# Patient Record
Sex: Female | Born: 1970 | Race: White | Hispanic: No | Marital: Single | Smoking: Never smoker
Health system: Southern US, Community
[De-identification: ages and names within clinical notes are randomized; demographics above are authoritative.]

## PROBLEM LIST (undated history)

## (undated) DIAGNOSIS — D219 Benign neoplasm of connective and other soft tissue, unspecified: Secondary | ICD-10-CM

## (undated) DIAGNOSIS — N200 Calculus of kidney: Secondary | ICD-10-CM

## (undated) DIAGNOSIS — I10 Essential (primary) hypertension: Secondary | ICD-10-CM

## (undated) HISTORY — PX: FINGER SURGERY: SHX640

## (undated) HISTORY — PX: KNEE SURGERY: SHX244

## (undated) HISTORY — DX: Benign neoplasm of connective and other soft tissue, unspecified: D21.9

## (undated) HISTORY — PX: LITHOTRIPSY: SUR834

## (undated) HISTORY — PX: TUBAL LIGATION: SHX77

## (undated) HISTORY — PX: APPENDECTOMY: SHX54

---

## 2004-06-01 ENCOUNTER — Inpatient Hospital Stay: Payer: Self-pay | Admitting: Surgery

## 2004-06-01 ENCOUNTER — Other Ambulatory Visit: Payer: Self-pay

## 2004-09-11 ENCOUNTER — Emergency Department: Payer: Self-pay | Admitting: Emergency Medicine

## 2005-03-08 ENCOUNTER — Emergency Department: Payer: Self-pay | Admitting: General Practice

## 2005-07-18 ENCOUNTER — Emergency Department: Payer: Self-pay | Admitting: Emergency Medicine

## 2005-10-16 ENCOUNTER — Emergency Department: Payer: Self-pay | Admitting: Internal Medicine

## 2005-12-28 ENCOUNTER — Emergency Department: Payer: Self-pay | Admitting: Emergency Medicine

## 2005-12-29 ENCOUNTER — Other Ambulatory Visit: Payer: Self-pay

## 2005-12-29 ENCOUNTER — Emergency Department: Payer: Self-pay | Admitting: Emergency Medicine

## 2006-06-19 ENCOUNTER — Emergency Department: Payer: Self-pay | Admitting: Emergency Medicine

## 2007-08-24 ENCOUNTER — Other Ambulatory Visit: Payer: Self-pay

## 2007-08-25 ENCOUNTER — Inpatient Hospital Stay: Payer: Self-pay | Admitting: Internal Medicine

## 2007-09-01 ENCOUNTER — Emergency Department: Payer: Self-pay | Admitting: Emergency Medicine

## 2007-09-06 ENCOUNTER — Emergency Department: Payer: Self-pay | Admitting: Emergency Medicine

## 2007-09-07 ENCOUNTER — Emergency Department: Payer: Self-pay | Admitting: Emergency Medicine

## 2008-09-04 ENCOUNTER — Ambulatory Visit: Payer: Self-pay | Admitting: Family Medicine

## 2008-10-07 ENCOUNTER — Emergency Department: Payer: Self-pay | Admitting: Emergency Medicine

## 2008-10-20 ENCOUNTER — Emergency Department: Payer: Self-pay | Admitting: Emergency Medicine

## 2008-11-01 ENCOUNTER — Emergency Department: Payer: Self-pay | Admitting: Emergency Medicine

## 2008-12-20 ENCOUNTER — Emergency Department: Payer: Self-pay | Admitting: Emergency Medicine

## 2008-12-22 ENCOUNTER — Emergency Department (HOSPITAL_COMMUNITY): Admission: EM | Admit: 2008-12-22 | Discharge: 2008-12-22 | Payer: Self-pay | Admitting: Emergency Medicine

## 2009-02-03 ENCOUNTER — Encounter: Payer: Self-pay | Admitting: Obstetrics & Gynecology

## 2009-02-03 ENCOUNTER — Ambulatory Visit: Payer: Self-pay | Admitting: Obstetrics & Gynecology

## 2009-02-03 ENCOUNTER — Encounter: Payer: Self-pay | Admitting: Family Medicine

## 2009-02-03 LAB — CONVERTED CEMR LAB
Hemoglobin: 14.7 g/dL (ref 12.0–15.0)
INR: 1.1 (ref 0.0–1.5)
Prolactin: 8.3 ng/mL
WBC: 9.3 10*3/uL (ref 4.0–10.5)

## 2009-04-24 ENCOUNTER — Emergency Department (HOSPITAL_COMMUNITY): Admission: EM | Admit: 2009-04-24 | Discharge: 2009-04-24 | Payer: Self-pay | Admitting: Emergency Medicine

## 2009-04-30 ENCOUNTER — Emergency Department (HOSPITAL_COMMUNITY): Admission: EM | Admit: 2009-04-30 | Discharge: 2009-04-30 | Payer: Self-pay | Admitting: Emergency Medicine

## 2009-05-26 ENCOUNTER — Ambulatory Visit: Payer: Self-pay | Admitting: Family Medicine

## 2009-05-27 ENCOUNTER — Emergency Department (HOSPITAL_COMMUNITY): Admission: EM | Admit: 2009-05-27 | Discharge: 2009-05-27 | Payer: Self-pay | Admitting: Emergency Medicine

## 2009-06-10 ENCOUNTER — Emergency Department (HOSPITAL_COMMUNITY): Admission: EM | Admit: 2009-06-10 | Discharge: 2009-06-10 | Payer: Self-pay | Admitting: Emergency Medicine

## 2009-06-12 ENCOUNTER — Ambulatory Visit (HOSPITAL_BASED_OUTPATIENT_CLINIC_OR_DEPARTMENT_OTHER): Admission: RE | Admit: 2009-06-12 | Discharge: 2009-06-12 | Payer: Self-pay | Admitting: Urology

## 2009-06-14 ENCOUNTER — Emergency Department (HOSPITAL_COMMUNITY): Admission: EM | Admit: 2009-06-14 | Discharge: 2009-06-14 | Payer: Self-pay | Admitting: Emergency Medicine

## 2009-06-16 ENCOUNTER — Emergency Department: Payer: Self-pay | Admitting: Emergency Medicine

## 2009-06-17 ENCOUNTER — Emergency Department (HOSPITAL_COMMUNITY): Admission: EM | Admit: 2009-06-17 | Discharge: 2009-06-17 | Payer: Self-pay | Admitting: Emergency Medicine

## 2009-07-03 ENCOUNTER — Ambulatory Visit: Payer: Self-pay | Admitting: Pain Medicine

## 2009-09-18 ENCOUNTER — Emergency Department: Payer: Self-pay | Admitting: Emergency Medicine

## 2009-09-25 ENCOUNTER — Emergency Department: Payer: Self-pay | Admitting: Emergency Medicine

## 2009-11-17 ENCOUNTER — Emergency Department (HOSPITAL_COMMUNITY): Admission: EM | Admit: 2009-11-17 | Discharge: 2009-11-17 | Payer: Self-pay | Admitting: Emergency Medicine

## 2010-01-06 ENCOUNTER — Emergency Department (HOSPITAL_COMMUNITY): Admission: EM | Admit: 2010-01-06 | Discharge: 2010-01-06 | Payer: Self-pay | Admitting: Emergency Medicine

## 2010-01-13 ENCOUNTER — Emergency Department: Payer: Self-pay | Admitting: Internal Medicine

## 2010-01-14 ENCOUNTER — Emergency Department (HOSPITAL_COMMUNITY): Admission: EM | Admit: 2010-01-14 | Discharge: 2010-01-14 | Payer: Self-pay | Admitting: Internal Medicine

## 2010-02-14 ENCOUNTER — Emergency Department: Payer: Self-pay | Admitting: Emergency Medicine

## 2010-02-22 ENCOUNTER — Emergency Department: Payer: Self-pay | Admitting: Emergency Medicine

## 2010-04-09 ENCOUNTER — Emergency Department: Payer: Self-pay | Admitting: Emergency Medicine

## 2010-04-25 IMAGING — CR DG HIP (WITH OR WITHOUT PELVIS) 2-3V*L*
3 series · 3 of 3 positions shown · non-contrast
Comparison: None

CLINICAL DATA: Fell with back pain and left hip pain

LEFT HIP - COMPLETE 2+ VIEW

[t pelvis a.p.]
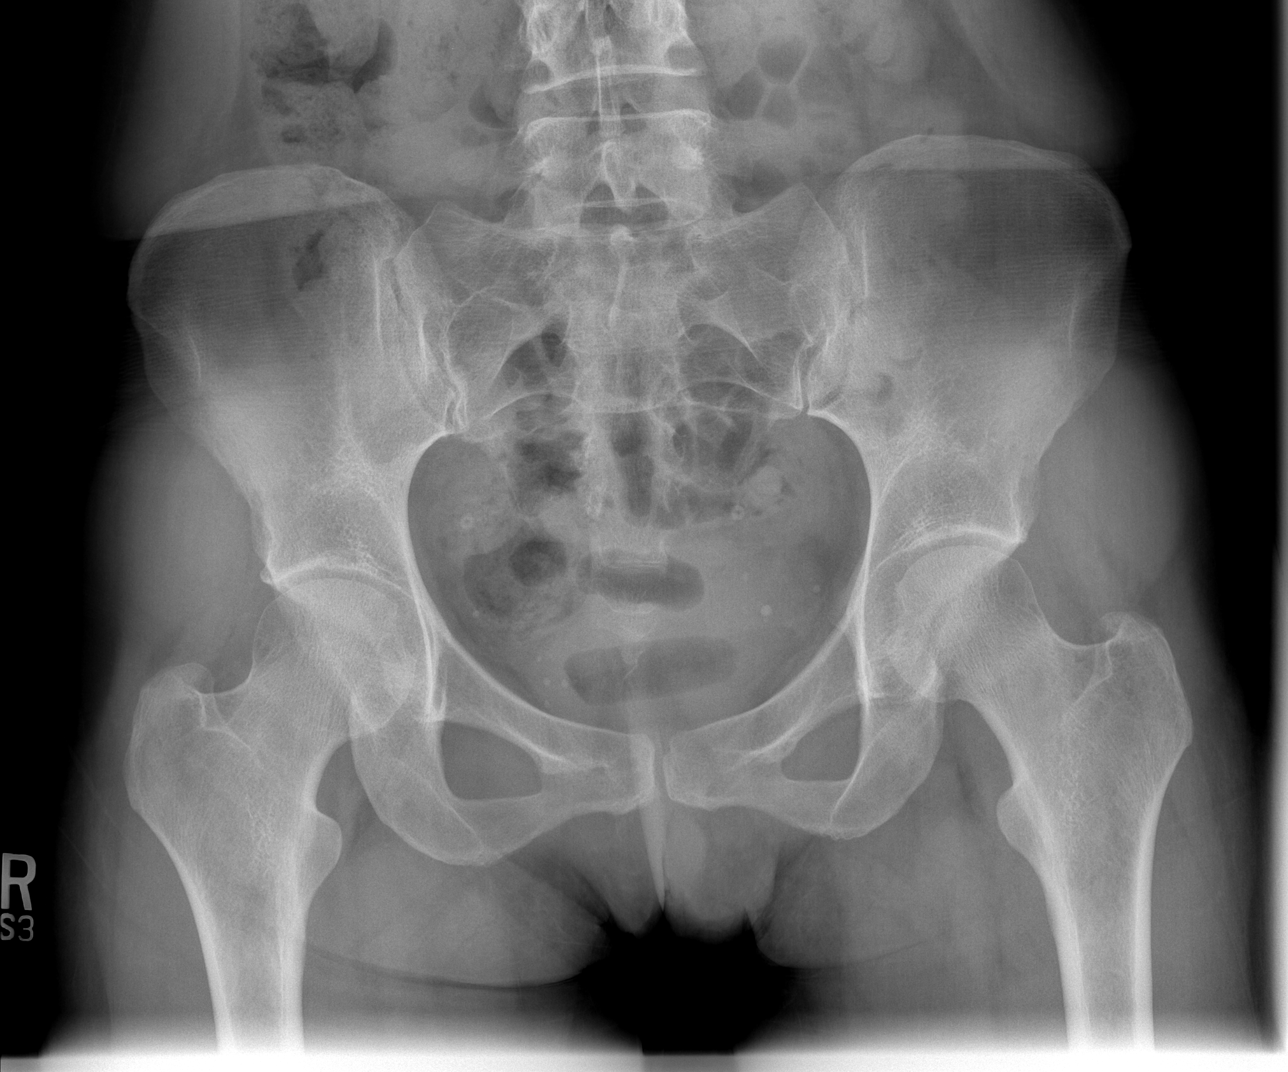

[t hip ap left]
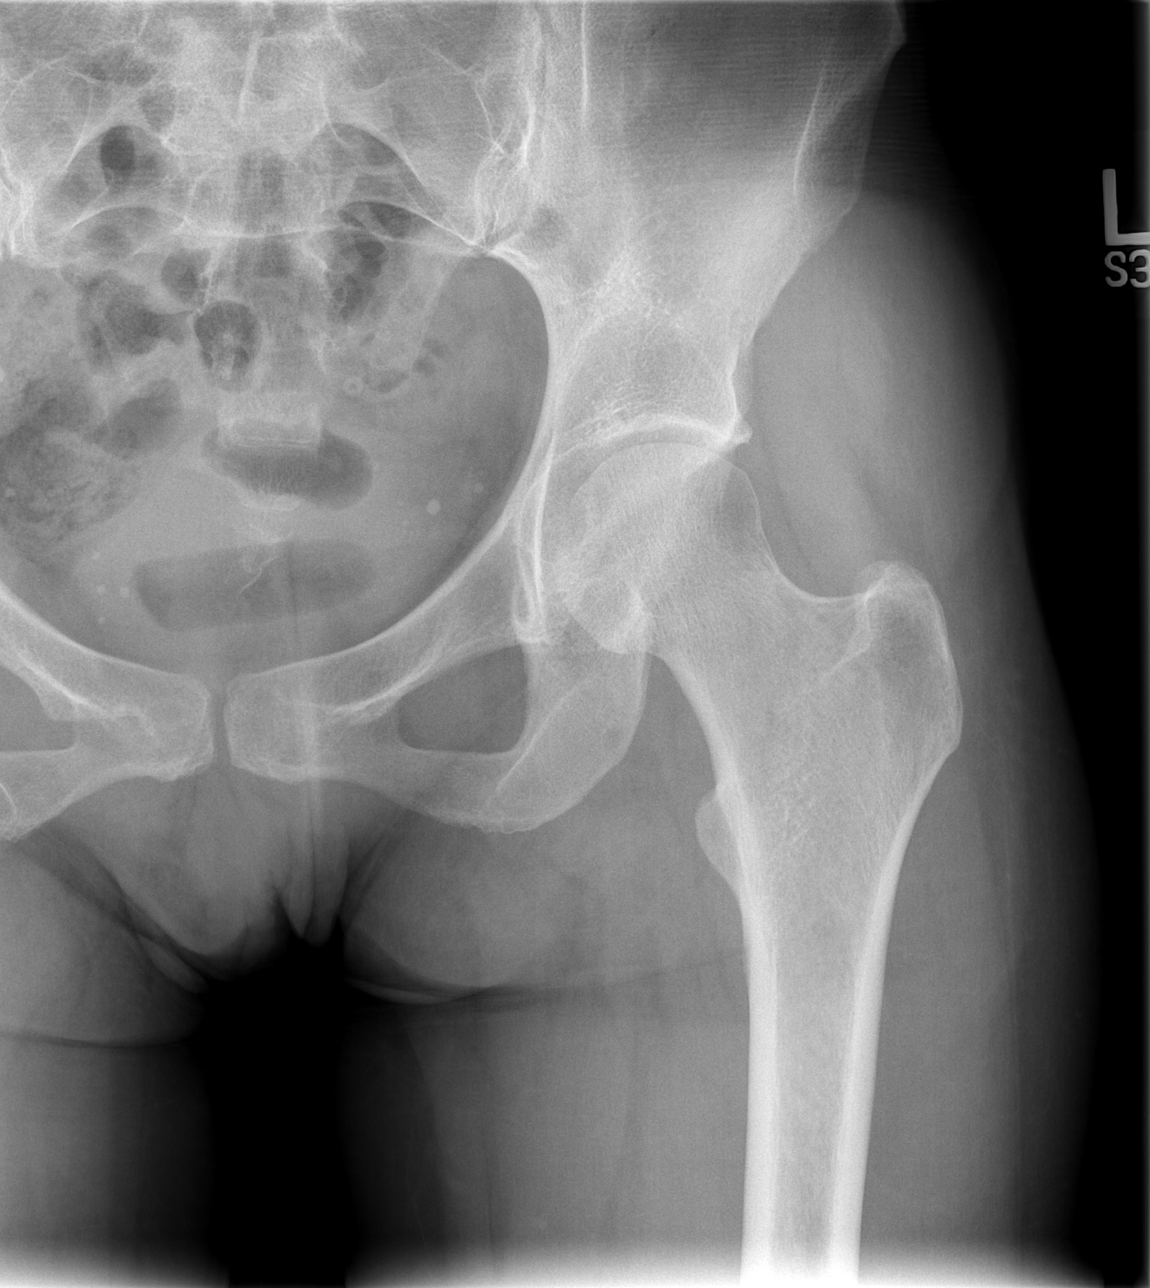

[t hip frog leg left]
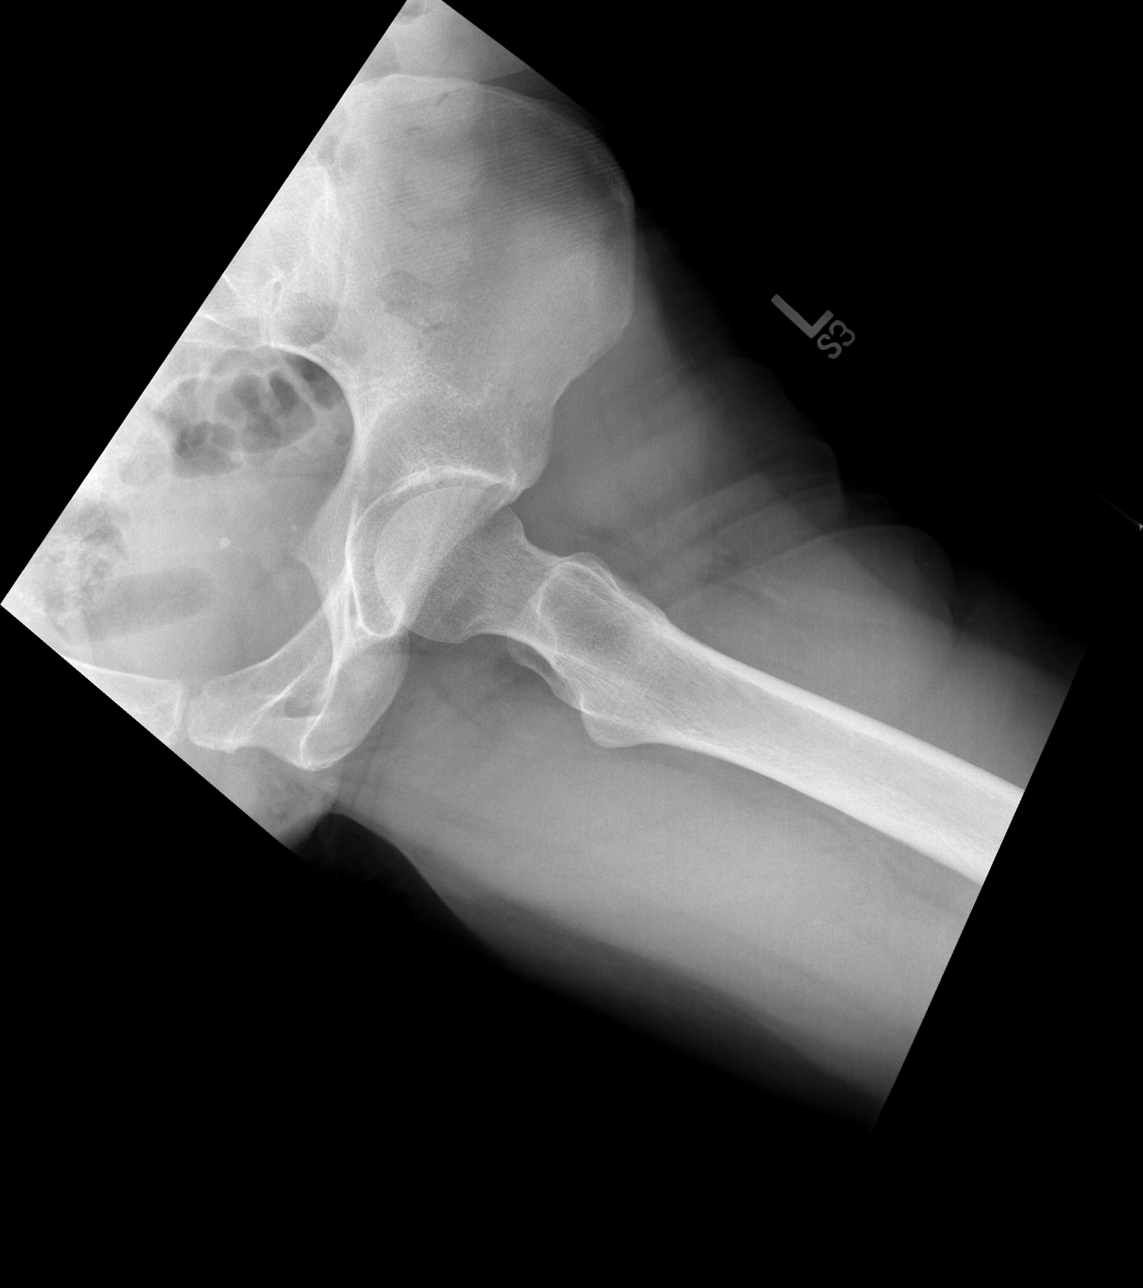

[3 of 3 positions shown; findings below may reference images not displayed]

FINDINGS: Both hips are in normal position.  No acute fracture is
seen.  The pelvic rami are intact and the SI joints appear normal.
IMPRESSION: Negative left hip.

## 2010-05-02 ENCOUNTER — Emergency Department: Payer: Self-pay | Admitting: Emergency Medicine

## 2010-05-03 ENCOUNTER — Emergency Department: Payer: Self-pay | Admitting: Internal Medicine

## 2010-05-12 ENCOUNTER — Ambulatory Visit: Payer: Self-pay | Admitting: Unknown Physician Specialty

## 2010-05-15 ENCOUNTER — Encounter
Admission: RE | Admit: 2010-05-15 | Discharge: 2010-05-19 | Payer: Self-pay | Source: Home / Self Care | Attending: Physical Medicine & Rehabilitation | Admitting: Physical Medicine & Rehabilitation

## 2010-05-19 ENCOUNTER — Ambulatory Visit: Payer: Self-pay | Admitting: Physical Medicine & Rehabilitation

## 2010-05-30 ENCOUNTER — Ambulatory Visit: Payer: Self-pay | Admitting: Unknown Physician Specialty

## 2010-08-07 ENCOUNTER — Ambulatory Visit: Payer: Self-pay | Admitting: Physical Medicine & Rehabilitation

## 2010-09-07 ENCOUNTER — Emergency Department: Payer: Self-pay | Admitting: Emergency Medicine

## 2010-10-09 ENCOUNTER — Emergency Department: Payer: Self-pay | Admitting: Emergency Medicine

## 2010-11-17 LAB — BASIC METABOLIC PANEL
BUN: 9 mg/dL (ref 6–23)
CO2: 33 mEq/L — ABNORMAL HIGH (ref 19–32)
Calcium: 10.1 mg/dL (ref 8.4–10.5)
Chloride: 103 mEq/L (ref 96–112)
Creatinine, Ser: 1.05 mg/dL (ref 0.4–1.2)
GFR calc non Af Amer: 59 mL/min — ABNORMAL LOW (ref >60)
Glucose, Bld: 90 mg/dL (ref 70–99)
Potassium: 4.5 mEq/L (ref 3.5–5.1)

## 2010-11-17 LAB — URINE MICROSCOPIC-ADD ON

## 2010-11-17 LAB — CBC
Hemoglobin: 16.9 g/dL — ABNORMAL HIGH (ref 12.0–15.0)
MCHC: 34.8 g/dL (ref 30.0–36.0)
RDW: 13.6 % (ref 11.5–15.5)

## 2010-11-17 LAB — URINALYSIS, ROUTINE W REFLEX MICROSCOPIC
Glucose, UA: NEGATIVE mg/dL
Ketones, ur: 15 mg/dL — AB
pH: 7 (ref 5.0–8.0)

## 2010-11-17 LAB — DIFFERENTIAL
Basophils Relative: 0 % (ref 0–1)
Lymphocytes Relative: 24 % (ref 12–46)

## 2010-11-17 LAB — POCT PREGNANCY, URINE: Preg Test, Ur: NEGATIVE

## 2010-11-18 ENCOUNTER — Emergency Department: Payer: Self-pay | Admitting: Emergency Medicine

## 2010-12-03 LAB — URINALYSIS, ROUTINE W REFLEX MICROSCOPIC
Bilirubin Urine: NEGATIVE
Glucose, UA: NEGATIVE mg/dL
Glucose, UA: NEGATIVE mg/dL
Ketones, ur: NEGATIVE mg/dL
Ketones, ur: NEGATIVE mg/dL
Ketones, ur: 15 mg/dL — AB
Leukocytes, UA: NEGATIVE
Nitrite: NEGATIVE
Protein, ur: NEGATIVE mg/dL
Protein, ur: 30 mg/dL — AB
Urobilinogen, UA: 0.2 mg/dL (ref 0.0–1.0)
pH: 8 (ref 5.0–8.0)

## 2010-12-03 LAB — COMPREHENSIVE METABOLIC PANEL
ALT: 15 U/L (ref 0–35)
Albumin: 3.6 g/dL (ref 3.5–5.2)
Alkaline Phosphatase: 35 U/L — ABNORMAL LOW (ref 39–117)
GFR calc Af Amer: >60 mL/min (ref >60)
Potassium: 3.9 mEq/L (ref 3.5–5.1)
Sodium: 138 mEq/L (ref 135–145)

## 2010-12-03 LAB — PREGNANCY, URINE: Preg Test, Ur: NEGATIVE

## 2010-12-03 LAB — CBC
HCT: 35.5 % — ABNORMAL LOW (ref 36.0–46.0)
Hemoglobin: 13.3 g/dL (ref 12.0–15.0)
MCHC: 34.9 g/dL (ref 30.0–36.0)
MCV: 91.8 fL (ref 78.0–100.0)
MCV: 90.6 fL (ref 78.0–100.0)
Platelets: 156 10*3/uL (ref 150–400)
RBC: 4.2 MIL/uL (ref 3.87–5.11)
RBC: 4.3 MIL/uL (ref 3.87–5.11)
RDW: 13.2 % (ref 11.5–15.5)
RDW: 13.6 % (ref 11.5–15.5)
WBC: 6.5 10*3/uL (ref 4.0–10.5)
WBC: 6.6 10*3/uL (ref 4.0–10.5)

## 2010-12-03 LAB — POCT I-STAT, CHEM 8
BUN: 13 mg/dL (ref 6–23)
Chloride: 105 mEq/L (ref 96–112)
Creatinine, Ser: 0.8 mg/dL (ref 0.4–1.2)
Glucose, Bld: 88 mg/dL (ref 70–99)
Potassium: 3.9 mEq/L (ref 3.5–5.1)
Sodium: 139 mEq/L (ref 135–145)

## 2010-12-03 LAB — DIFFERENTIAL
Basophils Absolute: 0 10*3/uL (ref 0.0–0.1)
Basophils Absolute: 0 10*3/uL (ref 0.0–0.1)
Basophils Relative: 0 % (ref 0–1)
Eosinophils Absolute: 0 10*3/uL (ref 0.0–0.7)
Eosinophils Absolute: 0 10*3/uL (ref 0.0–0.7)
Eosinophils Absolute: 0 10*3/uL (ref 0.0–0.7)
Eosinophils Relative: 0 % (ref 0–5)
Lymphocytes Relative: 14 % (ref 12–46)
Lymphocytes Relative: 31 % (ref 12–46)
Lymphs Abs: 0.9 10*3/uL (ref 0.7–4.0)
Lymphs Abs: 1.4 10*3/uL (ref 0.7–4.0)
Lymphs Abs: 2 10*3/uL (ref 0.7–4.0)
Monocytes Relative: 7 % (ref 3–12)
Monocytes Relative: 9 % (ref 3–12)
Neutro Abs: 3.9 10*3/uL (ref 1.7–7.7)
Neutrophils Relative %: 59 % (ref 43–77)

## 2010-12-03 LAB — URINE MICROSCOPIC-ADD ON

## 2010-12-03 LAB — BASIC METABOLIC PANEL
BUN: 12 mg/dL (ref 6–23)
Chloride: 105 mEq/L (ref 96–112)
Glucose, Bld: 86 mg/dL (ref 70–99)
Potassium: 4 mEq/L (ref 3.5–5.1)

## 2010-12-03 LAB — LIPASE, BLOOD: Lipase: 23 U/L (ref 11–59)

## 2010-12-04 LAB — BASIC METABOLIC PANEL WITH GFR
BUN: 13 mg/dL (ref 6–23)
CO2: 25 meq/L (ref 19–32)
Calcium: 8.7 mg/dL (ref 8.4–10.5)
Chloride: 108 meq/L (ref 96–112)
Creatinine, Ser: 1.09 mg/dL (ref 0.4–1.2)
GFR calc non Af Amer: 57 mL/min — ABNORMAL LOW
Glucose, Bld: 81 mg/dL (ref 70–99)
Potassium: 3.8 meq/L (ref 3.5–5.1)
Sodium: 138 meq/L (ref 135–145)

## 2010-12-04 LAB — CBC
HCT: 39.9 % (ref 36.0–46.0)
Hemoglobin: 13.8 g/dL (ref 12.0–15.0)
MCHC: 34.6 g/dL (ref 30.0–36.0)
MCV: 90.8 fL (ref 78.0–100.0)
Platelets: 141 K/uL — ABNORMAL LOW (ref 150–400)
RBC: 4.4 MIL/uL (ref 3.87–5.11)
RDW: 12.7 % (ref 11.5–15.5)
WBC: 5.4 K/uL (ref 4.0–10.5)

## 2010-12-04 LAB — WET PREP, GENITAL
Clue Cells Wet Prep HPF POC: NONE SEEN
Trich, Wet Prep: NONE SEEN
Yeast Wet Prep HPF POC: NONE SEEN

## 2010-12-04 LAB — GC/CHLAMYDIA PROBE AMP, GENITAL
Chlamydia, DNA Probe: NEGATIVE
GC Probe Amp, Genital: NEGATIVE

## 2010-12-04 LAB — URINE MICROSCOPIC-ADD ON

## 2010-12-04 LAB — URINALYSIS, ROUTINE W REFLEX MICROSCOPIC
Glucose, UA: NEGATIVE mg/dL
Specific Gravity, Urine: 1.04 — ABNORMAL HIGH (ref 1.005–1.030)

## 2010-12-04 LAB — DIFFERENTIAL
Basophils Relative: 0 % (ref 0–1)
Eosinophils Absolute: 0.1 10*3/uL (ref 0.0–0.7)
Monocytes Relative: 9 % (ref 3–12)
Neutro Abs: 3.5 10*3/uL (ref 1.7–7.7)
Neutrophils Relative %: 65 % (ref 43–77)

## 2011-01-12 NOTE — Assessment & Plan Note (Signed)
Nancy Roberson, Nancy Roberson             ACCOUNT NO.:  [id number]   MEDICAL RECORD NO.:  [id number]          PATIENT TYPE:  POB   LOCATION:  [facility] at [facility]         FACILITY:  [facility]   PHYSICIAN:  Johnella Moloney, MD        DATE OF BIRTH:  04/10/1971   DATE OF SERVICE:  02/03/2009                                  CLINIC NOTE   CHIEF COMPLAINT:  Menometrorrhagia, pelvic pain, possible left breast  lump.   HISTORY OF PRESENT ILLNESS:  The patient is a 40 year old gravida 5,  para 3-0-2-3 who presents today with a few issues.  The patient reports  menometrorrhagia.  She had menarche at age 56 and has never had regular  cycles because she says for many years her periods came about every 1 or  1-1/2 months and lasted for 2-3 days.  However in November 2009, she had  an episode of bleeding that lasted over 2 months.  After that, she had  another episode of bleeding in about 6 weeks later and then over the  last month she bled continuously almost every day.  The patient is very  concerned about all those bleeding.  She does report that she had a  pelvic ultrasound at [facility] in February 2010  which was within normal limits.  However, we do not have access to this  ultrasound at this point, but she feels like her symptoms have gotten  worse since then.  The patient denies any dizziness, syncope, or any  other symptoms.  The patient's 2nd issue is of pelvic pain.  She has  experienced continuous pain on her right side for the last week.  The  patient has been seen in the past for ovulation pain and other episodes  of pelvic pain on the right side which with a negative evaluation this  pain is not unlike any pain that she has had before, but it has been  persistent for the last week.  Next issue is of her left breast lump.  The patient does have a history of her mother and grandmother diagnosed  in their 34s.  The grandmother also has a history of ovarian cancer.  The  patient is very concerned about this lump and wants further  evaluation.  The patient denies any other complaints.   PAST OBSTETRICS/GYNECOLOGIC HISTORY:  Menstrual history as above.  The  patient has had 2 miscarriages, one vaginal delivery and one cesarean  section.  She also had a bilateral tubal ligation.  Her last Pap smear  was several months ago which was normal.  She has had a history of  cervical dysplasia many years ago but normal Pap afterwards.  She has  never had a mammogram.   PAST MEDICAL HISTORY:  Nephritis, asthma, hypercholesterinemia, high  blood pressure.   PAST SURGICAL HISTORY:  Cesarean section and thumb reconstruction.   MEDICATIONS:  None.   ALLERGIES:  PENICILLIN and BIAXIN.   SOCIAL HISTORY:  The patient lives with her significant other.  She does  not smoke, drink, or use any drugs.  She denies any past or current  history of sexual physical abuse.   FAMILY HISTORY:  Remarkable for mother who was diagnosed with breast  cancer in the 67s, grandmother with breast and ovarian cancer in her  83s.  There is also an extensive history of heart disease and high blood  pressure in the family.  The patient and her family members have not  undergone any genetic testing for breast cancer or ovarian cancer  syndromes.   REVIEW OF SYSTEMS:  The patient has multiple symptoms including  bruising, numbness, muscle aches, fatigue, weight loss, frequent  headaches, hot flashes, vaginal bleeding, and pain with intercourse.   PHYSICAL EXAMINATION:  VITAL SIGNS:  Blood pressure 148/93, pulse 85,  weight 136, height 5 feet 5 inches.  GENERAL:  No apparent distress.  LUNGS:  Clear to auscultation bilaterally.  HEART:  Regular rate and rhythm.  NECK:  Supple.  No masses.  BREASTS:  Symmetric in size, nontender.  The patient has heterogeneous  dense tissue bilaterally.  No circumscribed masses palpated on the left  or the right side.  No abnormal skin changes or nipple  drainage or  lymphadenopathy palpated.  ABDOMEN:  Soft, some right lower quadrant tenderness on deep palpation.  No rebound or guarding.  EXTREMITIES:  The patient was noted to have some bruising on bilateral  lower extremities which she says started about a week ago, otherwise, no  clubbing or edema.  Nontender.   ENDOMETRIAL BIOPSY:  The patient was counseled regarding the need for  endometrial biopsy given her irregular bleeding.  The risks of procedure  were reviewed with the patient and written and informed consent was  obtained.  During a pelvic examination, speculum was placed into the  vagina and her cervix was swabbed with Betadine x3.  A tenaculum was  placed on the anterior lip of the cervix and then a 3-mm Pipelle was  slowly introduced into uterine fundus.  This was introduced to a depth  of 8 cm and suction was created over the device and the Pipelle was  slowly rotated.  Moderate amount of tissue was obtained after 2 passes  and the best tenaculum was removed from the patient's cervix and all  instruments were removed.  The patient tolerated the procedure well.  She will follow up in 2 weeks for discussion of results.   ASSESSMENT AND PLAN:  The patient is a 40 year old gravida 5, para 3-0-2-  3 here for workup for menometrorrhagia, pelvic pain, and possible left  breast lump.  As for her menometrorrhagia, she underwent an endometrial  biopsy today.  We will get a pelvic ultrasound which will also help in  working up her pelvic pain and follow up results.  The patient would  also be checked for a prolactin level, TSH, CBC, and also a DIC panel,  also given the bruise and that was noted on her extremities.  As for her  breast lump, this was not appreciated in her examination but given that  her first-degree relative was diagnosed with breast cancer in her 75s.  She does meet criteria for mammogram at this point, so this will be set  up for her and we will follow up  results.  Given her multiple medical  problems, the patient will also be referred to [facility] for  primary care establishment.  She was told to follow up as scheduled and  to call or come back in for any further gynecologic concerns.           ______________________________  Johnella Moloney, MD  UD/MEDQ  D:  02/03/2009  T:  02/04/2009  Job:  [id number]

## 2011-02-08 ENCOUNTER — Emergency Department: Payer: Self-pay | Admitting: Emergency Medicine

## 2011-03-02 ENCOUNTER — Ambulatory Visit: Payer: Self-pay | Admitting: Obstetrics & Gynecology

## 2011-03-02 DIAGNOSIS — Z01419 Encounter for gynecological examination (general) (routine) without abnormal findings: Secondary | ICD-10-CM

## 2011-03-10 ENCOUNTER — Inpatient Hospital Stay: Payer: Self-pay | Admitting: Psychiatry

## 2011-03-19 ENCOUNTER — Ambulatory Visit: Payer: Self-pay | Admitting: Unknown Physician Specialty

## 2011-03-31 ENCOUNTER — Ambulatory Visit: Payer: Self-pay | Admitting: Unknown Physician Specialty

## 2011-11-09 ENCOUNTER — Encounter (HOSPITAL_COMMUNITY): Payer: Self-pay | Admitting: *Deleted

## 2011-11-09 ENCOUNTER — Emergency Department (HOSPITAL_COMMUNITY)
Admission: EM | Admit: 2011-11-09 | Discharge: 2011-11-09 | Disposition: A | Discharge status: Home / Self Care | Payer: Medicare Other | Attending: Emergency Medicine | Admitting: Emergency Medicine

## 2011-11-09 DIAGNOSIS — R109 Unspecified abdominal pain: Secondary | ICD-10-CM | POA: Insufficient documentation

## 2011-11-09 HISTORY — DX: Calculus of kidney: N20.0

## 2011-11-09 LAB — URINALYSIS, ROUTINE W REFLEX MICROSCOPIC
Bilirubin Urine: NEGATIVE
Glucose, UA: NEGATIVE mg/dL
Hgb urine dipstick: NEGATIVE
Ketones, ur: NEGATIVE mg/dL
Specific Gravity, Urine: 1.006 (ref 1.005–1.030)
pH: 6.5 (ref 5.0–8.0)

## 2011-11-09 MED ORDER — OXYCODONE-ACETAMINOPHEN 5-325 MG PO TABS: 1.0000 | ORAL_TABLET | Freq: Four times a day (QID) | ORAL | Status: AC | PRN

## 2011-11-09 MED ORDER — KETOROLAC TROMETHAMINE 30 MG/ML IJ SOLN
30.0000 mg | Freq: Once | INTRAMUSCULAR | Status: AC
  Administered 2011-11-09: 30 mg via INTRAMUSCULAR
  Filled 2011-11-09: qty 1

## 2011-11-09 MED ORDER — ONDANSETRON HCL 4 MG PO TABS: 4.0000 mg | ORAL_TABLET | Freq: Four times a day (QID) | ORAL | Status: AC

## 2011-11-09 NOTE — Discharge Instructions (Signed)
Flank Pain Flank pain refers to pain that is located on the side of the body between the upper abdomen and the back. It can be caused by many things. CAUSES  Some of the more common causes of flank pain include:  Muscle strain.   Muscle spasms.   A disease of your spine (vertebral disk disease).   A lung infection (pneumonia).   Fluid around your lungs (pulmonary edema).   A kidney infection.   Kidney stones.   A very painful skin rash on only one side of your body (shingles).   Gallbladder disease.  DIAGNOSIS  Blood tests, urine tests, and X-rays may help your caregiver determine what is wrong. TREATMENT  The treatment of pain depends on the cause. Your caregiver will determine what treatment will work best for you. HOME CARE INSTRUCTIONS   Home care will depend on the cause of your pain.   Some medications may help relieve the pain. Take medication for relief of pain as directed by your caregiver.   Tell your caregiver about any changes in your pain.   Follow up with your caregiver.  SEEK IMMEDIATE MEDICAL CARE IF:   Your pain is not controlled with medication.   The pain increases.   You have abdominal pain.   You have shortness of breath.   You have persistent nausea or vomiting.   You have swelling in your abdomen.   You feel faint or pass out.   You have a temperature by mouth above 102 F (38.9 C), not controlled by medicine.  MAKE SURE YOU:   Understand these instructions.   Will watch your condition.   Will get help right away if you are not doing well or get worse.  Document Released: 10/07/2005 Document Revised: 08/05/2011 Document Reviewed: 01/31/2010 [employer] Patient Information 2012 [employer]. 

## 2011-11-09 NOTE — ED Provider Notes (Signed)
History     CSN: [id number]  Arrival date & time 11/09/11  1123   First MD Initiated Contact with Patient 11/09/11 1221      Chief Complaint  Patient presents with  . Flank Pain   patient with a history of multiple previous kidney stones in the past, as per her own history. She began having similar left flank pain. Last night, with some nausea. No vomiting. No difficulty voiding, no hematuria. Patient was requesting pain medication, and urology referral. She has seen [facility] in [facility] past.  (Consider location/radiation/quality/duration/timing/severity/associated sxs/prior treatment) HPI  Past Medical History  Diagnosis Date  . Kidney stone     Past Surgical History  Procedure Date  . Tubal ligation   . Appendectomy   . Knee surgery   . Lithotripsy   . Finger surgery   . Cesarean section     No family history on file.  History  Substance Use Topics  . Smoking status: Never Smoker   . Smokeless tobacco: Not on file  . Alcohol Use: No    OB History    Grav Para Term Preterm Abortions TAB SAB Ect Mult Living                  Review of Systems  All other systems reviewed and are negative.    Allergies  Codeine and Penicillins  Home Medications   Current Outpatient Rx  Name Route Sig Dispense Refill  . OMEGA-3 FATTY ACIDS 1000 MG PO CAPS Oral Take 1 g by mouth daily.    . ADULT MULTIVITAMIN W/MINERALS CH Oral Take 1 tablet by mouth daily.    Marland Kitchen NAPROXEN SODIUM 220 MG PO TABS Oral Take 440 mg by mouth daily as needed. For pain    . TRAMADOL HCL 50 MG PO TABS Oral Take 50 mg by mouth every 6 (six) hours as needed. For pain      BP 132/83  Pulse 91  Temp(Src) 97.9 F (36.6 C) (Oral)  Resp 20  Ht 5\' 7"  (1.702 m)  Wt 154 lb (69.854 kg)  BMI 24.12 kg/m2  SpO2 100%  Physical Exam  Nursing note and vitals reviewed. Constitutional: She is oriented to person, place, and time. She appears well-developed and well-nourished.  HENT:  Head:  Normocephalic and atraumatic.  Eyes: Conjunctivae and EOM are normal. Pupils are equal, round, and reactive to light.  Neck: Neck supple.  Cardiovascular: Normal rate and regular rhythm.  Exam reveals no gallop and no friction rub.   No murmur heard. Pulmonary/Chest: Breath sounds normal. She has no wheezes. She has no rales. She exhibits no tenderness.  Abdominal: Soft. Bowel sounds are normal. She exhibits no distension. There is no tenderness. There is no rebound and no guarding.  Genitourinary:       Minimal left-sided CVA tenderness. Otherwise, unremarkable  Musculoskeletal: Normal range of motion.  Neurological: She is alert and oriented to person, place, and time. No cranial nerve deficit. Coordination normal.  Skin: Skin is warm and dry. No rash noted.  Psychiatric: She has a normal mood and affect.    ED Course  Procedures (including critical care time)   Labs Reviewed  URINALYSIS, ROUTINE W REFLEX MICROSCOPIC   No results found.   No diagnosis found.    MDM  Patient is seen and examined, initial history and physical is completed. Evaluation initiated      We'll give portal injection and check urinalysis. Likely will be stable for followup without need  for acute CAT scan at this time.  Results for orders placed during the hospital encounter of 11/09/11  URINALYSIS, ROUTINE W REFLEX MICROSCOPIC      Component Value Range   Color, Urine YELLOW  YELLOW    APPearance CLEAR  CLEAR    Specific Gravity, Urine 1.006  1.005 - 1.030    pH 6.5  5.0 - 8.0    Glucose, UA NEGATIVE  NEGATIVE (mg/dL)   Hgb urine dipstick NEGATIVE  NEGATIVE    Bilirubin Urine NEGATIVE  NEGATIVE    Ketones, ur NEGATIVE  NEGATIVE (mg/dL)   Protein, ur NEGATIVE  NEGATIVE (mg/dL)   Urobilinogen, UA 0.2  0.0 - 1.0 (mg/dL)   Nitrite NEGATIVE  NEGATIVE    Leukocytes, UA NEGATIVE  NEGATIVE    No results found.    Kase Shughart A. Patrica Duel, MD 11/09/11 1357

## 2011-11-09 NOTE — ED Notes (Signed)
Patient with hx of kidney stones.  She reports onset of left sided flank pain last night.  Patient denies diff voiding

## 2012-06-12 ENCOUNTER — Emergency Department: Payer: Self-pay | Admitting: Emergency Medicine

## 2012-06-12 LAB — BASIC METABOLIC PANEL
Anion Gap: 7 (ref 7–16)
Calcium, Total: 8.7 mg/dL (ref 8.5–10.1)
Co2: 27 mmol/L (ref 21–32)
Creatinine: 0.98 mg/dL (ref 0.60–1.30)
EGFR (African American): >60
EGFR (Non-African Amer.): >60
Glucose: 73 mg/dL (ref 65–99)

## 2012-08-08 ENCOUNTER — Ambulatory Visit: Payer: Self-pay | Admitting: Psychiatry

## 2012-08-16 ENCOUNTER — Emergency Department: Payer: Self-pay | Admitting: Internal Medicine

## 2012-08-16 LAB — TROPONIN I: Troponin-I: <0.02 ng/mL

## 2012-08-16 LAB — BASIC METABOLIC PANEL
BUN: 8 mg/dL (ref 7–18)
Chloride: 108 mmol/L — ABNORMAL HIGH (ref 98–107)
Co2: 24 mmol/L (ref 21–32)
Creatinine: 0.87 mg/dL (ref 0.60–1.30)
Potassium: 3.7 mmol/L (ref 3.5–5.1)
Sodium: 139 mmol/L (ref 136–145)

## 2012-08-16 LAB — CK TOTAL AND CKMB (NOT AT ARMC)
CK, Total: 79 U/L (ref 21–215)
CK-MB: 0.9 ng/mL (ref 0.5–3.6)

## 2012-08-16 LAB — CBC
HGB: 13.1 g/dL (ref 12.0–16.0)
Platelet: 212 10*3/uL (ref 150–440)
RBC: 4.46 10*6/uL (ref 3.80–5.20)

## 2012-08-30 ENCOUNTER — Ambulatory Visit: Payer: Self-pay | Admitting: Psychiatry

## 2012-09-30 ENCOUNTER — Ambulatory Visit: Payer: Self-pay | Admitting: Psychiatry

## 2015-06-17 ENCOUNTER — Other Ambulatory Visit: Payer: Self-pay | Admitting: Orthopedic Surgery

## 2015-06-17 DIAGNOSIS — M542 Cervicalgia: Secondary | ICD-10-CM

## 2015-06-17 DIAGNOSIS — M5030 Other cervical disc degeneration, unspecified cervical region: Secondary | ICD-10-CM

## 2015-07-08 ENCOUNTER — Ambulatory Visit: Admission: RE | Admit: 2015-07-08 | Payer: Medicare Other | Source: Ambulatory Visit

## 2016-02-25 DIAGNOSIS — M5430 Sciatica, unspecified side: Secondary | ICD-10-CM | POA: Diagnosis not present

## 2016-02-25 DIAGNOSIS — Z79891 Long term (current) use of opiate analgesic: Secondary | ICD-10-CM | POA: Diagnosis not present

## 2016-02-25 DIAGNOSIS — M542 Cervicalgia: Secondary | ICD-10-CM | POA: Diagnosis not present

## 2016-02-25 DIAGNOSIS — G894 Chronic pain syndrome: Secondary | ICD-10-CM | POA: Diagnosis not present

## 2016-07-01 DIAGNOSIS — R69 Illness, unspecified: Secondary | ICD-10-CM | POA: Diagnosis not present

## 2016-07-01 DIAGNOSIS — Z5181 Encounter for therapeutic drug level monitoring: Secondary | ICD-10-CM | POA: Diagnosis not present

## 2016-07-01 DIAGNOSIS — G894 Chronic pain syndrome: Secondary | ICD-10-CM | POA: Diagnosis not present

## 2016-07-01 DIAGNOSIS — M5430 Sciatica, unspecified side: Secondary | ICD-10-CM | POA: Diagnosis not present

## 2016-07-01 DIAGNOSIS — Z79891 Long term (current) use of opiate analgesic: Secondary | ICD-10-CM | POA: Diagnosis not present

## 2016-07-01 DIAGNOSIS — M542 Cervicalgia: Secondary | ICD-10-CM | POA: Diagnosis not present

## 2016-08-10 DIAGNOSIS — N926 Irregular menstruation, unspecified: Secondary | ICD-10-CM | POA: Diagnosis not present

## 2016-08-10 DIAGNOSIS — R1031 Right lower quadrant pain: Secondary | ICD-10-CM | POA: Diagnosis not present

## 2016-08-10 DIAGNOSIS — R140 Abdominal distension (gaseous): Secondary | ICD-10-CM | POA: Diagnosis not present

## 2016-08-10 DIAGNOSIS — M5030 Other cervical disc degeneration, unspecified cervical region: Secondary | ICD-10-CM | POA: Diagnosis not present

## 2016-08-10 DIAGNOSIS — D649 Anemia, unspecified: Secondary | ICD-10-CM | POA: Diagnosis not present

## 2016-09-20 DIAGNOSIS — Z5181 Encounter for therapeutic drug level monitoring: Secondary | ICD-10-CM | POA: Diagnosis not present

## 2016-09-20 DIAGNOSIS — Z79891 Long term (current) use of opiate analgesic: Secondary | ICD-10-CM | POA: Diagnosis not present

## 2016-09-20 DIAGNOSIS — G894 Chronic pain syndrome: Secondary | ICD-10-CM | POA: Diagnosis not present

## 2016-09-20 DIAGNOSIS — M5430 Sciatica, unspecified side: Secondary | ICD-10-CM | POA: Diagnosis not present

## 2016-09-20 DIAGNOSIS — M542 Cervicalgia: Secondary | ICD-10-CM | POA: Diagnosis not present

## 2016-10-21 ENCOUNTER — Emergency Department
Admission: EM | Admit: 2016-10-21 | Discharge: 2016-10-21 | Disposition: A | Discharge status: Home / Self Care | Payer: Medicare HMO | Attending: Emergency Medicine | Admitting: Emergency Medicine

## 2016-10-21 ENCOUNTER — Encounter: Payer: Self-pay | Admitting: Emergency Medicine

## 2016-10-21 ENCOUNTER — Emergency Department: Payer: Medicare HMO

## 2016-10-21 DIAGNOSIS — Y929 Unspecified place or not applicable: Secondary | ICD-10-CM | POA: Diagnosis not present

## 2016-10-21 DIAGNOSIS — M79641 Pain in right hand: Secondary | ICD-10-CM | POA: Diagnosis not present

## 2016-10-21 DIAGNOSIS — S161XXA Strain of muscle, fascia and tendon at neck level, initial encounter: Secondary | ICD-10-CM | POA: Diagnosis not present

## 2016-10-21 DIAGNOSIS — M25562 Pain in left knee: Secondary | ICD-10-CM | POA: Diagnosis not present

## 2016-10-21 DIAGNOSIS — M25512 Pain in left shoulder: Secondary | ICD-10-CM | POA: Diagnosis not present

## 2016-10-21 DIAGNOSIS — Y9389 Activity, other specified: Secondary | ICD-10-CM | POA: Insufficient documentation

## 2016-10-21 DIAGNOSIS — S199XXA Unspecified injury of neck, initial encounter: Secondary | ICD-10-CM | POA: Diagnosis not present

## 2016-10-21 DIAGNOSIS — M25519 Pain in unspecified shoulder: Secondary | ICD-10-CM | POA: Diagnosis not present

## 2016-10-21 DIAGNOSIS — S4992XA Unspecified injury of left shoulder and upper arm, initial encounter: Secondary | ICD-10-CM | POA: Diagnosis not present

## 2016-10-21 DIAGNOSIS — M7918 Myalgia, other site: Secondary | ICD-10-CM

## 2016-10-21 DIAGNOSIS — M79662 Pain in left lower leg: Secondary | ICD-10-CM | POA: Diagnosis not present

## 2016-10-21 DIAGNOSIS — S8992XA Unspecified injury of left lower leg, initial encounter: Secondary | ICD-10-CM | POA: Diagnosis not present

## 2016-10-21 DIAGNOSIS — Y999 Unspecified external cause status: Secondary | ICD-10-CM | POA: Diagnosis not present

## 2016-10-21 DIAGNOSIS — M542 Cervicalgia: Secondary | ICD-10-CM | POA: Diagnosis not present

## 2016-10-21 MED ORDER — ORPHENADRINE CITRATE 30 MG/ML IJ SOLN
60.0000 mg | INTRAMUSCULAR | Status: AC
  Administered 2016-10-21: 60 mg via INTRAMUSCULAR
  Filled 2016-10-21: qty 2

## 2016-10-21 MED ORDER — TRAMADOL HCL 50 MG PO TABS: 50.0000 mg | ORAL_TABLET | Freq: Three times a day (TID) | ORAL | 0 refills | Status: DC | PRN

## 2016-10-21 MED ORDER — CYCLOBENZAPRINE HCL 5 MG PO TABS: 5.0000 mg | ORAL_TABLET | Freq: Three times a day (TID) | ORAL | 0 refills | Status: DC | PRN

## 2016-10-21 MED ORDER — NABUMETONE 750 MG PO TABS: 750.0000 mg | ORAL_TABLET | Freq: Two times a day (BID) | ORAL | 0 refills | Status: DC

## 2016-10-21 MED ORDER — KETOROLAC TROMETHAMINE 60 MG/2ML IM SOLN
30.0000 mg | Freq: Once | INTRAMUSCULAR | Status: AC
  Administered 2016-10-21: 30 mg via INTRAMUSCULAR
  Filled 2016-10-21: qty 2
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
## 2018-03-05 ENCOUNTER — Encounter: Payer: Self-pay | Admitting: Emergency Medicine

## 2018-03-05 ENCOUNTER — Other Ambulatory Visit: Payer: Self-pay

## 2018-03-05 ENCOUNTER — Emergency Department
Admission: EM | Admit: 2018-03-05 | Discharge: 2018-03-05 | Disposition: A | Discharge status: Home / Self Care | Payer: Medicare Other | Source: Home / Self Care | Attending: Emergency Medicine | Admitting: Emergency Medicine

## 2018-03-05 ENCOUNTER — Emergency Department
Admission: EM | Admit: 2018-03-05 | Discharge: 2018-03-05 | Disposition: A | Discharge status: Home / Self Care | Payer: Medicare Other | Attending: Emergency Medicine | Admitting: Emergency Medicine

## 2018-03-05 ENCOUNTER — Emergency Department: Payer: Medicare Other

## 2018-03-05 DIAGNOSIS — Z79899 Other long term (current) drug therapy: Secondary | ICD-10-CM

## 2018-03-05 DIAGNOSIS — R1013 Epigastric pain: Secondary | ICD-10-CM

## 2018-03-05 DIAGNOSIS — K8020 Calculus of gallbladder without cholecystitis without obstruction: Secondary | ICD-10-CM

## 2018-03-05 DIAGNOSIS — R1084 Generalized abdominal pain: Secondary | ICD-10-CM | POA: Diagnosis not present

## 2018-03-05 DIAGNOSIS — Z5321 Procedure and treatment not carried out due to patient leaving prior to being seen by health care provider: Secondary | ICD-10-CM | POA: Diagnosis not present

## 2018-03-05 DIAGNOSIS — I10 Essential (primary) hypertension: Secondary | ICD-10-CM | POA: Insufficient documentation

## 2018-03-05 DIAGNOSIS — Z87442 Personal history of urinary calculi: Secondary | ICD-10-CM | POA: Insufficient documentation

## 2018-03-05 HISTORY — DX: Essential (primary) hypertension: I10

## 2018-03-05 LAB — BASIC METABOLIC PANEL
Anion gap: 11 (ref 5–15)
BUN: 9 mg/dL (ref 6–20)
CO2: 27 mmol/L (ref 22–32)
CREATININE: 0.83 mg/dL (ref 0.44–1.00)
Calcium: 9.4 mg/dL (ref 8.9–10.3)
Chloride: 107 mmol/L (ref 98–111)
GFR calc Af Amer: >60 mL/min (ref >60)
GFR calc non Af Amer: >60 mL/min (ref >60)
GLUCOSE: 65 mg/dL — AB (ref 70–99)
Potassium: 3.3 mmol/L — ABNORMAL LOW (ref 3.5–5.1)
SODIUM: 145 mmol/L (ref 135–145)

## 2018-03-05 LAB — URINALYSIS, COMPLETE (UACMP) WITH MICROSCOPIC
BILIRUBIN URINE: NEGATIVE
GLUCOSE, UA: NEGATIVE mg/dL
HGB URINE DIPSTICK: NEGATIVE
Ketones, ur: NEGATIVE mg/dL
NITRITE: NEGATIVE
Protein, ur: NEGATIVE mg/dL
SPECIFIC GRAVITY, URINE: 1.015 (ref 1.005–1.030)
pH: 6 (ref 5.0–8.0)

## 2018-03-05 LAB — CBC
HEMATOCRIT: 40.2 % (ref 35.0–47.0)
Hemoglobin: 13.9 g/dL (ref 12.0–16.0)
MCH: 29.5 pg (ref 26.0–34.0)
MCHC: 34.5 g/dL (ref 32.0–36.0)
MCV: 85.4 fL (ref 80.0–100.0)
PLATELETS: 261 10*3/uL (ref 150–440)
RBC: 4.7 MIL/uL (ref 3.80–5.20)
RDW: 13.9 % (ref 11.5–14.5)
WBC: 6.2 10*3/uL (ref 3.6–11.0)

## 2018-03-05 LAB — POCT PREGNANCY, URINE: PREG TEST UR: NEGATIVE

## 2018-03-05 MED ORDER — HALOPERIDOL LACTATE 5 MG/ML IJ SOLN
5.0000 mg | Freq: Once | INTRAMUSCULAR | Status: AC
  Administered 2018-03-05: 5 mg via INTRAVENOUS
  Filled 2018-03-05: qty 1

## 2018-03-05 NOTE — Discharge Instructions (Addendum)
Please seek medical attention for any high fevers, chest pain, shortness of breath, change in behavior, persistent vomiting, bloody stool or any other new or concerning symptoms.  

## 2018-03-05 NOTE — ED Notes (Signed)
IV catheter left in room 20 gauge

## 2018-03-05 NOTE — ED Notes (Signed)
Unable to locate patient in lobby to give update regarding wait time.

## 2018-03-05 NOTE — ED Notes (Signed)
Upon RN entering room pt was not in room, RN looked for pt in waiting area and entrance to give Discharge instructions

## 2018-03-05 NOTE — ED Notes (Signed)
Dr. Goodman at bedside.  

## 2018-03-05 NOTE — ED Triage Notes (Addendum)
Pt arrived via POV with reports of left flank pain radiating down abdomen. Pt states "I think it's my gallbladder" pt states she has had kidney stones in the past but pain does not resemble previous kidney stones. Pain started on Friday. Pt also reports nausea.

## 2018-03-05 NOTE — ED Triage Notes (Signed)
Pt comes into the ED via POV c/o mi abdominal pain accompanied with N/V.  Denies any diarrhea, chest pain, shortness of breath or dizziness.  Patient states she has had increased "gassiness".  Patient has known gallstones that were blocking the bile duct.  Patient has even and unlabored respirations at this time.  States the pain started 3 days ago and has progressed each day.

## 2018-03-05 NOTE — ED Notes (Signed)
Patient transported to Ultrasound 

## 2018-03-05 NOTE — ED Notes (Signed)
Unable to locate patient in waiting room or restroom.

## 2018-03-05 NOTE — ED Provider Notes (Signed)
[facility] Emergency Department Provider Note   ____________________________________________   I have reviewed the triage vital signs and the nursing notes.   HISTORY  Chief Complaint Abdominal Pain   History limited by: Not Limited   HPI Nancy Roberson is a 47 y.o. female who presents to the emergency department today because of concern for abdominal pain. Patient states that she has had intermittent abdominal pain over the past six months. She does state that over the past few days it has been worse. It is worse in the epigastric region however does go over most of her stomach. The patient has not noticed any change in defecation. She states that she was told she has stones in her common bile duct before. She has not had any fevers.    Per medical record review patient has a history of kidney stone.   Past Medical History:  Diagnosis Date  . Hypertension   . Kidney stone     There are no active problems to display for this patient.   Past Surgical History:  Procedure Laterality Date  . APPENDECTOMY    . CESAREAN SECTION    . FINGER SURGERY    . KNEE SURGERY    . LITHOTRIPSY    . TUBAL LIGATION      Prior to Admission medications   Medication Sig Start Date End Date Taking? Authorizing Provider  cyclobenzaprine (FLEXERIL) 5 MG tablet Take 1 tablet (5 mg total) by mouth 3 (three) times daily as needed for muscle spasms. 10/21/16   Menshew, Dannielle Karvonen, PA-C  fish oil-omega-3 fatty acids 1000 MG capsule Take 1 g by mouth daily.    [provider]  Multiple Vitamin (MULITIVITAMIN WITH MINERALS) TABS Take 1 tablet by mouth daily.    [provider]  nabumetone (RELAFEN) 750 MG tablet Take 1 tablet (750 mg total) by mouth 2 (two) times daily. 10/21/16   Menshew, Dannielle Karvonen, PA-C  naproxen sodium (ANAPROX) 220 MG tablet Take 440 mg by mouth daily as needed. For pain    [provider]  traMADol (ULTRAM) 50 MG  tablet Take 50 mg by mouth every 6 (six) hours as needed. For pain    [provider]  traMADol (ULTRAM) 50 MG tablet Take 1 tablet (50 mg total) by mouth 3 (three) times daily as needed. 10/21/16   Menshew, Dannielle Karvonen, PA-C    Allergies Lidocaine; Mango flavor; Duloxetine; Mango flavor [flavoring agent]; Scopolamine; Codeine; and Penicillins  No family history on file.  Social History Social History   Tobacco Use  . Smoking status: Never Smoker  . Smokeless tobacco: Never Used  Substance Use Topics  . Alcohol use: No  . Drug use: No    Review of Systems Constitutional: No fever/chills Eyes: No visual changes. ENT: No sore throat. Cardiovascular: Denies chest pain. Respiratory: Denies shortness of breath. Gastrointestinal: Positive for abdominal pain.  Genitourinary: Negative for dysuria. Musculoskeletal: Negative for back pain. Skin: Negative for rash. Neurological: Negative for headaches, focal weakness or numbness.  ____________________________________________   PHYSICAL EXAM:  VITAL SIGNS: ED Triage Vitals  Enc Vitals Group     BP 03/05/18 2158 (!) 141/92     Pulse Rate 03/05/18 2158 71     Resp 03/05/18 2158 17     Temp 03/05/18 2158 97.7 F (36.5 C)     Temp Source 03/05/18 2158 Oral     SpO2 03/05/18 2158 99 %     Weight 03/05/18  2159 148 lb (67.1 kg)     Height 03/05/18 2159 5\' 6"  (1.676 m)     Head Circumference --      Peak Flow --      Pain Score 03/05/18 2159 10   Constitutional: Alert and oriented.  Eyes: Conjunctivae are normal.  ENT      Head: Normocephalic and atraumatic.      Nose: No congestion/rhinnorhea.      Mouth/Throat: Mucous membranes are moist.      Neck: No stridor. Hematological/Lymphatic/Immunilogical: No cervical lymphadenopathy. Cardiovascular: Normal rate, regular rhythm.  No murmurs, rubs, or gallops.  Respiratory: Normal respiratory effort without tachypnea nor retractions. Breath sounds are clear and equal  bilaterally. No wheezes/rales/rhonchi. Gastrointestinal: Soft and non tender. No rebound. No guarding.  Genitourinary: Deferred Musculoskeletal: Normal range of motion in all extremities. No lower extremity edema. Neurologic:  Normal speech and language. No gross focal neurologic deficits are appreciated.  Skin:  Skin is warm, dry and intact. No rash noted. Psychiatric: Mood and affect are normal. Speech and behavior are normal. Patient exhibits appropriate insight and judgment.  ____________________________________________    LABS (pertinent positives/negatives)  Labs from visit earlier today reviewed  ____________________________________________   EKG  None  ____________________________________________    RADIOLOGY  RUQ [place] Gallstones  ____________________________________________   PROCEDURES  Procedures  ____________________________________________   INITIAL IMPRESSION / ASSESSMENT AND PLAN / ED COURSE  Pertinent labs & imaging results that were available during my care of the patient were reviewed by me and considered in my medical decision making (see chart for details).   Patient presented to the emergency department today because of concerns for abdominal pain.  She did actually checked in earlier today and had blood work drawn but left before she could be evaluated.  That blood work did not show any concerning leukocytosis or other findings.  Given the patient states she had a history of gallstones right upper quadrant ultrasound was performed.  Did show gallstones and may be somewhat small gallbladder wall distention however no other concerning findings for acute cholecystitis.  Given lack of fever or concerning blood work I doubt acute cholecystitis at this time.  Patient did feel improvement after medication.  Discussed with patient importance of following up.   ____________________________________________   FINAL CLINICAL IMPRESSION(S) / ED  DIAGNOSES  Final diagnoses:  Epigastric abdominal pain  Gallstones     Note: This dictation was prepared with Dragon dictation. Any transcriptional errors that result from this process are unintentional     Nance Pear, MD 03/05/18 2316

## 2019-02-01 IMAGING — US US ABDOMEN LIMITED
1 series · 14 of 25 positions shown · non-contrast
Comparison: Prior CT from 03/09/2011

CLINICAL DATA: Initial evaluation for acute epigastric pain.

EXAM:
ULTRASOUND ABDOMEN LIMITED RIGHT UPPER QUADRANT

[Series 1: us abdomen limited · 0.17mm/px · 14 of 40 slices shown]
[im 1/40]
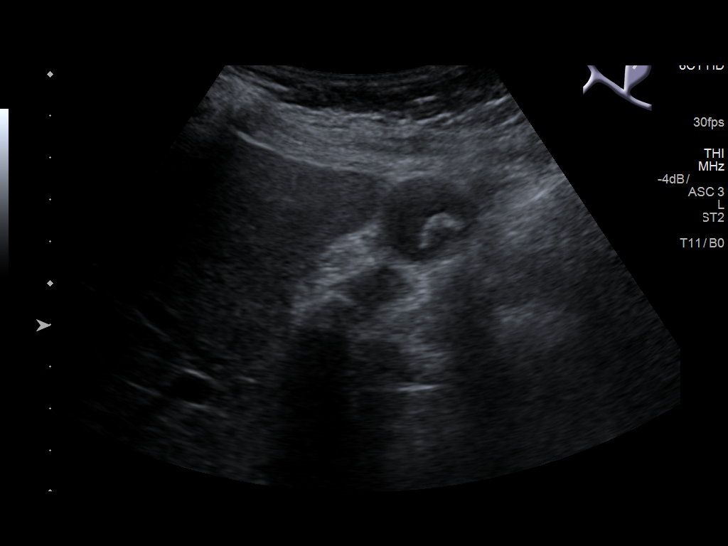
[im 4/40]
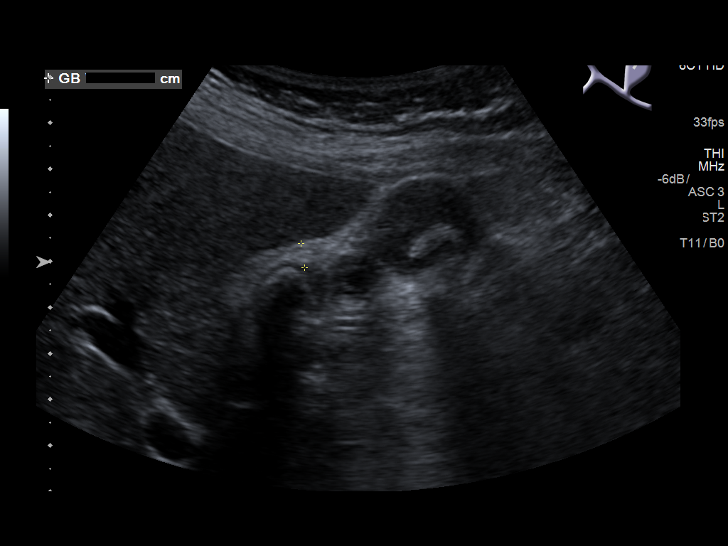
[im 7/40]
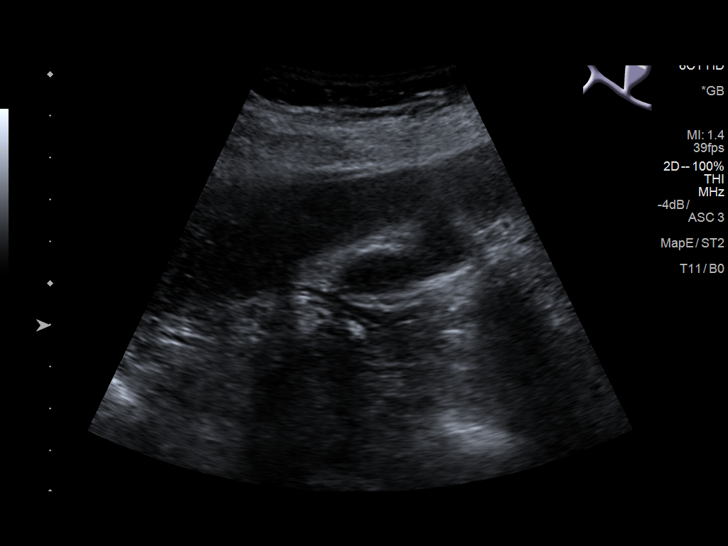
[im 10/40]
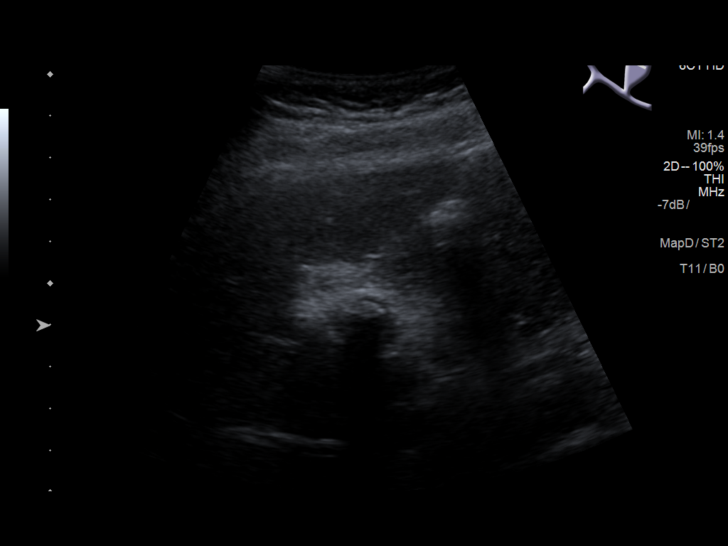
[im 14/40]
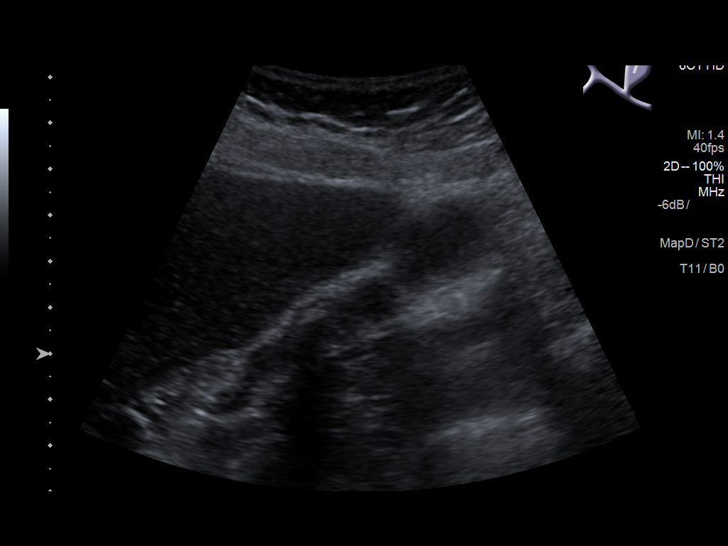
[im 15/40]
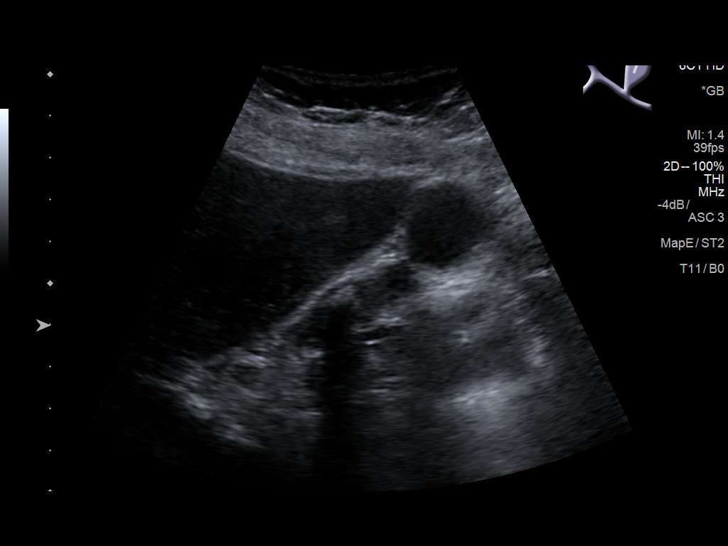
[im 18/40]
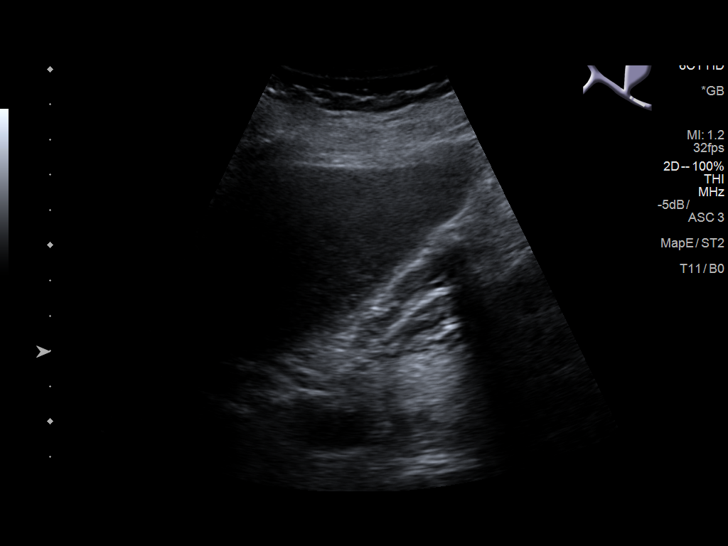
[im 22/40]
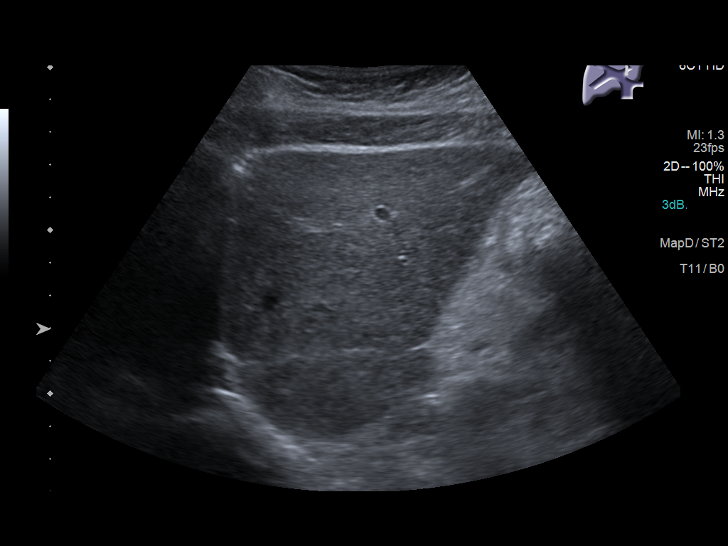
[im 25/40]
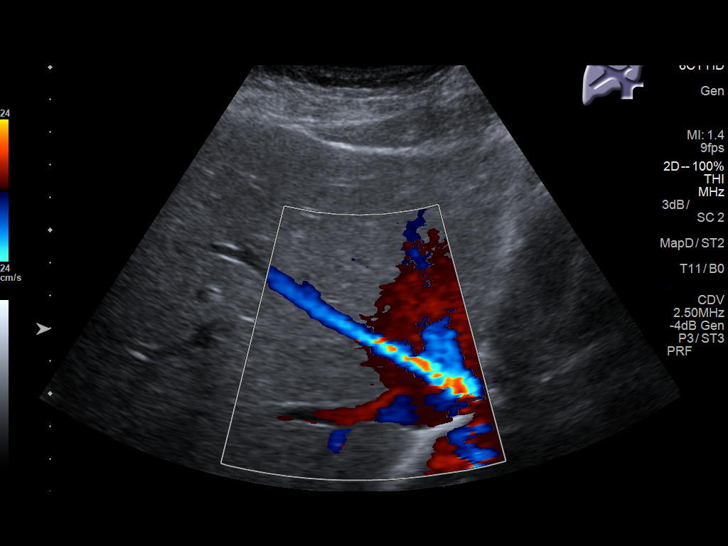
[im 27/40]
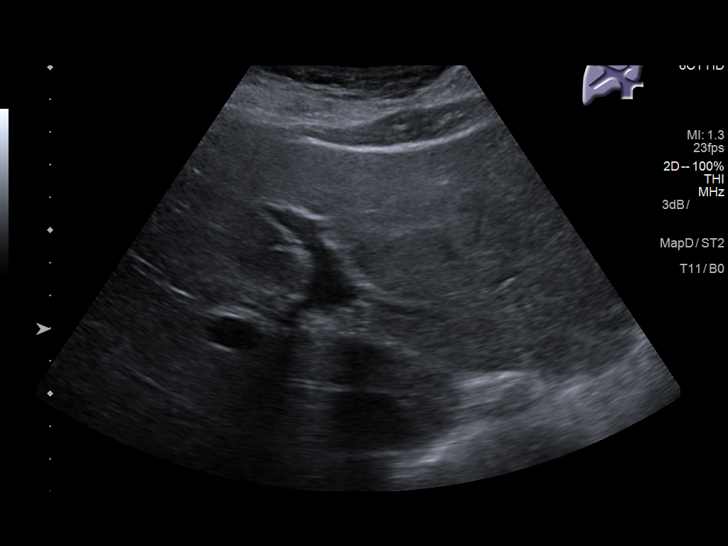
[im 30/40]
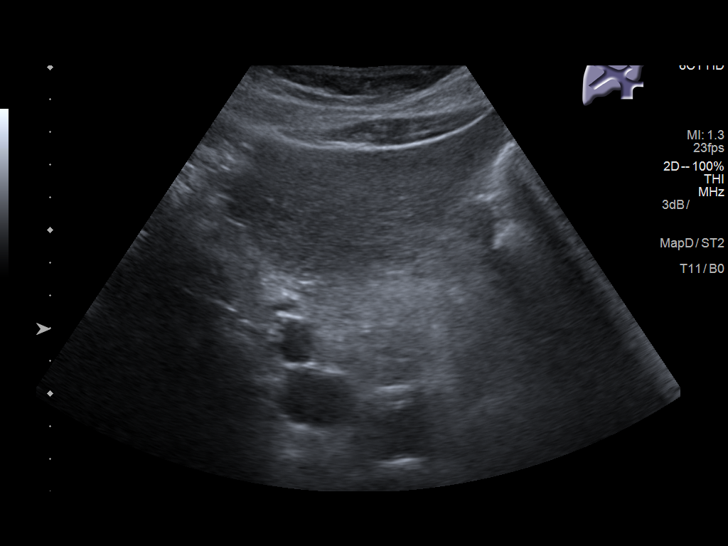
[im 33/40]
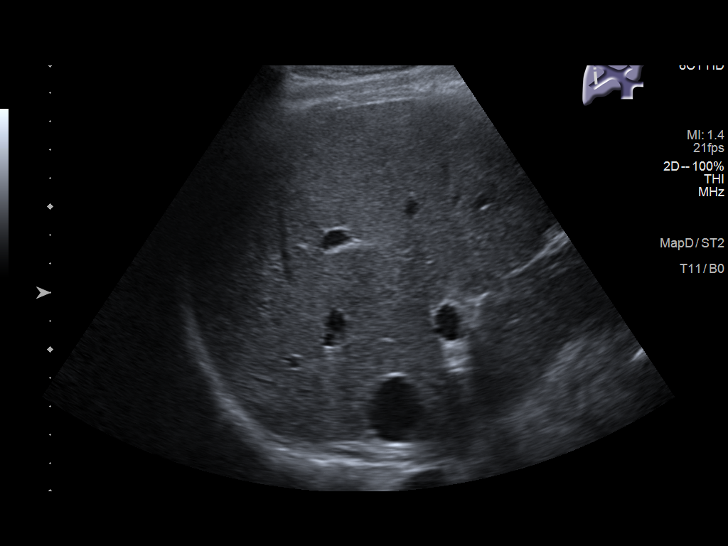
[im 36/40]
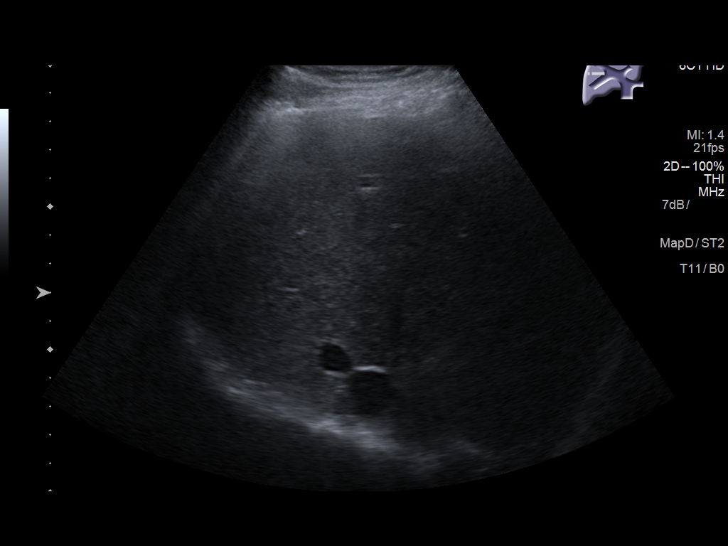
[im 40/40]
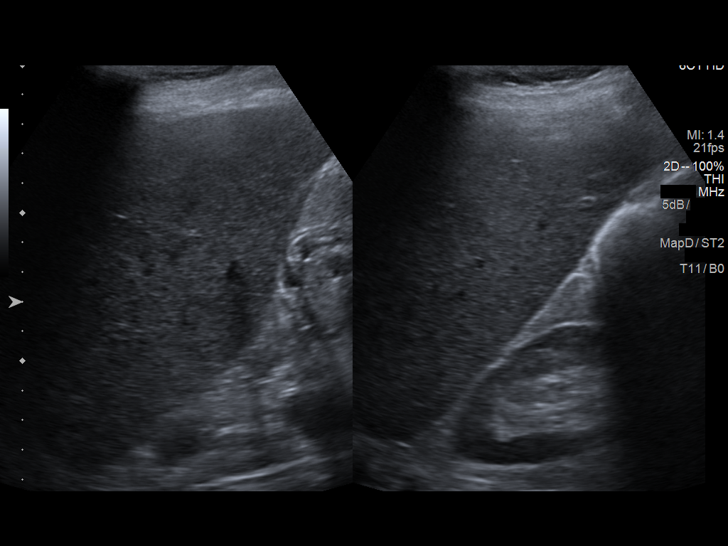

[14 of 25 positions shown; findings below may reference images not displayed]

FINDINGS: Gallbladder:

Multiple nonmobile stones seen within the gallbladder, largest of
which measures approximately 1.1 cm. Gallbladder appears contracted
with mild wall thickening up to 5 mm. No free pericholecystic fluid.
No sonographic Murphy sign elicited on exam.

Common bile duct:

Diameter: 3 mm

Liver:

No focal lesion identified. Within normal limits in parenchymal
echogenicity. Portal vein is patent on color Doppler imaging with
normal direction of blood flow towards the liver.
IMPRESSION: 1. Cholelithiasis. Mild gallbladder wall thickening favored to be
related to incomplete distension. No other sonographic features for
acute cholecystitis.
2. No biliary dilatation.

## 2019-10-05 ENCOUNTER — Other Ambulatory Visit: Payer: Self-pay | Admitting: Family Medicine

## 2019-10-05 DIAGNOSIS — Z1231 Encounter for screening mammogram for malignant neoplasm of breast: Secondary | ICD-10-CM

## 2021-10-30 ENCOUNTER — Other Ambulatory Visit: Payer: Self-pay | Admitting: Family Medicine

## 2021-10-30 DIAGNOSIS — Z1231 Encounter for screening mammogram for malignant neoplasm of breast: Secondary | ICD-10-CM

## 2021-10-30 DIAGNOSIS — M899 Disorder of bone, unspecified: Secondary | ICD-10-CM

## 2021-12-31 ENCOUNTER — Other Ambulatory Visit: Payer: Medicare Other

## 2022-02-09 ENCOUNTER — Encounter: Payer: Medicare (Managed Care) | Admitting: Family Medicine

## 2022-03-08 ENCOUNTER — Encounter: Payer: Self-pay | Admitting: Obstetrics & Gynecology

## 2022-03-08 ENCOUNTER — Encounter: Payer: Medicare (Managed Care) | Admitting: Family Medicine

## 2022-03-08 ENCOUNTER — Other Ambulatory Visit (HOSPITAL_COMMUNITY)
Admission: RE | Admit: 2022-03-08 | Discharge: 2022-03-08 | Disposition: A | Discharge status: Home / Self Care | Payer: Medicare (Managed Care) | Source: Ambulatory Visit | Attending: Obstetrics & Gynecology | Admitting: Obstetrics & Gynecology

## 2022-03-08 ENCOUNTER — Ambulatory Visit (INDEPENDENT_AMBULATORY_CARE_PROVIDER_SITE_OTHER): Payer: Medicare (Managed Care) | Admitting: Obstetrics & Gynecology

## 2022-03-08 VITALS — BP 120/80 | Ht 66.0 in | Wt 184.0 lb

## 2022-03-08 DIAGNOSIS — Z01419 Encounter for gynecological examination (general) (routine) without abnormal findings: Secondary | ICD-10-CM | POA: Insufficient documentation

## 2022-03-08 DIAGNOSIS — Z01411 Encounter for gynecological examination (general) (routine) with abnormal findings: Secondary | ICD-10-CM | POA: Diagnosis not present

## 2022-03-08 DIAGNOSIS — N950 Postmenopausal bleeding: Secondary | ICD-10-CM

## 2022-03-08 DIAGNOSIS — Z1231 Encounter for screening mammogram for malignant neoplasm of breast: Secondary | ICD-10-CM

## 2022-03-08 DIAGNOSIS — Z1151 Encounter for screening for human papillomavirus (HPV): Secondary | ICD-10-CM | POA: Diagnosis present

## 2022-03-08 DIAGNOSIS — Z124 Encounter for screening for malignant neoplasm of cervix: Secondary | ICD-10-CM

## 2022-03-08 NOTE — Addendum Note (Signed)
Addended by: Emily Filbert on: 03/08/2022 01:59 PM   Modules accepted: Orders
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
## 2022-03-10 LAB — CYTOLOGY - PAP
Comment: NEGATIVE
Diagnosis: NEGATIVE
High risk HPV: NEGATIVE

## 2022-03-13 ENCOUNTER — Encounter: Payer: Self-pay | Admitting: Obstetrics & Gynecology

## 2022-03-15 ENCOUNTER — Ambulatory Visit: Payer: Medicare (Managed Care)

## 2022-03-24 ENCOUNTER — Other Ambulatory Visit: Payer: Self-pay | Admitting: Obstetrics & Gynecology

## 2022-03-24 DIAGNOSIS — Z1231 Encounter for screening mammogram for malignant neoplasm of breast: Secondary | ICD-10-CM

## 2022-03-29 ENCOUNTER — Ambulatory Visit: Payer: Medicare (Managed Care) | Attending: Obstetrics & Gynecology

## 2022-03-30 ENCOUNTER — Inpatient Hospital Stay: Admission: RE | Admit: 2022-03-30 | Payer: Medicare (Managed Care) | Source: Ambulatory Visit

## 2022-03-30 DIAGNOSIS — Z1231 Encounter for screening mammogram for malignant neoplasm of breast: Secondary | ICD-10-CM

## 2022-04-05 ENCOUNTER — Ambulatory Visit: Payer: Medicare (Managed Care) | Attending: Obstetrics & Gynecology

## 2022-04-28 ENCOUNTER — Ambulatory Visit
Admission: RE | Admit: 2022-04-28 | Discharge: 2022-04-28 | Disposition: A | Discharge status: Home / Self Care | Payer: Medicare (Managed Care) | Source: Ambulatory Visit | Attending: Obstetrics & Gynecology | Admitting: Obstetrics & Gynecology

## 2022-04-28 ENCOUNTER — Other Ambulatory Visit: Payer: Self-pay | Admitting: Obstetrics & Gynecology

## 2022-04-28 DIAGNOSIS — N950 Postmenopausal bleeding: Secondary | ICD-10-CM | POA: Insufficient documentation

## 2022-04-28 DIAGNOSIS — D219 Benign neoplasm of connective and other soft tissue, unspecified: Secondary | ICD-10-CM | POA: Insufficient documentation

## 2022-06-10 ENCOUNTER — Other Ambulatory Visit: Payer: Self-pay | Admitting: Advanced Practice Midwife

## 2022-06-10 ENCOUNTER — Telehealth: Payer: Self-pay

## 2022-06-10 DIAGNOSIS — N951 Menopausal and female climacteric states: Secondary | ICD-10-CM

## 2022-06-10 MED ORDER — ESTRADIOL 0.5 MG PO TABS: 0.5000 mg | ORAL_TABLET | Freq: Every day | ORAL | 11 refills | Status: DC

## 2022-06-10 NOTE — Telephone Encounter (Signed)
Pt called triage to f/u on a hormonal supplement Dr. Hulan Fray told her she was going to send for her. Pt is having severe sweats and no energy. She knows Dr. Hulan Fray is out office for a while and she cannot wait. Can you pls advise?

## 2022-06-11 NOTE — Telephone Encounter (Signed)
Called pt, no answer, LVMTRC. 

## 2023-02-15 ENCOUNTER — Other Ambulatory Visit: Payer: Self-pay | Admitting: Family Medicine

## 2023-02-15 DIAGNOSIS — Z1231 Encounter for screening mammogram for malignant neoplasm of breast: Secondary | ICD-10-CM

## 2023-03-01 ENCOUNTER — Ambulatory Visit
Admission: RE | Admit: 2023-03-01 | Discharge: 2023-03-01 | Disposition: A | Discharge status: Home / Self Care | Payer: Medicare (Managed Care) | Source: Ambulatory Visit | Attending: Family Medicine | Admitting: Family Medicine

## 2023-03-01 DIAGNOSIS — Z1231 Encounter for screening mammogram for malignant neoplasm of breast: Secondary | ICD-10-CM | POA: Insufficient documentation

## 2023-03-10 ENCOUNTER — Other Ambulatory Visit: Payer: Self-pay | Admitting: *Deleted

## 2023-03-10 ENCOUNTER — Inpatient Hospital Stay
Admission: RE | Admit: 2023-03-10 | Discharge: 2023-03-10 | Disposition: A | Discharge status: Home / Self Care | Payer: Self-pay | Source: Ambulatory Visit | Attending: Family Medicine | Admitting: Family Medicine

## 2023-03-10 DIAGNOSIS — Z1231 Encounter for screening mammogram for malignant neoplasm of breast: Secondary | ICD-10-CM

## 2023-03-17 ENCOUNTER — Other Ambulatory Visit: Payer: Self-pay | Admitting: Family Medicine

## 2023-03-17 DIAGNOSIS — R928 Other abnormal and inconclusive findings on diagnostic imaging of breast: Secondary | ICD-10-CM

## 2023-03-17 DIAGNOSIS — N63 Unspecified lump in unspecified breast: Secondary | ICD-10-CM

## 2023-03-17 DIAGNOSIS — N6489 Other specified disorders of breast: Secondary | ICD-10-CM

## 2023-04-11 ENCOUNTER — Ambulatory Visit: Payer: Medicare (Managed Care) | Admitting: Obstetrics & Gynecology

## 2023-04-19 ENCOUNTER — Ambulatory Visit: Payer: Medicare (Managed Care) | Admitting: Obstetrics & Gynecology

## 2023-06-24 ENCOUNTER — Telehealth: Payer: Self-pay

## 2023-06-24 DIAGNOSIS — N951 Menopausal and female climacteric states: Secondary | ICD-10-CM

## 2023-06-24 MED ORDER — ESTRADIOL 0.5 MG PO TABS
0.5000 mg | ORAL_TABLET | Freq: Every day | ORAL | 0 refills | Status: DC
Start: 2023-06-24 — End: 2024-02-06

## 2023-06-24 NOTE — Telephone Encounter (Signed)
Nancy Roberson called triage stating she scheduled her annual 07/11/23 with Dr. Marice Potter and that she needs a refill on her Estradiol, I told her I would send in a refill to hold her over until her appointment.

## 2023-06-26 ENCOUNTER — Other Ambulatory Visit: Payer: Self-pay | Admitting: Obstetrics & Gynecology

## 2023-06-26 DIAGNOSIS — N951 Menopausal and female climacteric states: Secondary | ICD-10-CM

## 2023-07-11 ENCOUNTER — Ambulatory Visit: Payer: Medicare (Managed Care) | Admitting: Obstetrics & Gynecology

## 2023-07-12 ENCOUNTER — Telehealth: Payer: Self-pay | Admitting: Obstetrics

## 2023-07-12 NOTE — Telephone Encounter (Signed)
Dr. Lonny Prude is out of the office on 11/20. I contacted the patient via phone. "Call couldn't be completed as dialed". Unable to reach the patient for rescheduled.

## 2023-07-13 NOTE — Telephone Encounter (Signed)
I contacted the patient via phone. "Call couldn't be completed as dialed". Unable to reach the patient for rescheduled.

## 2023-07-20 ENCOUNTER — Ambulatory Visit: Payer: Medicare (Managed Care) | Admitting: Obstetrics

## 2023-07-20 NOTE — Progress Notes (Deleted)
GYNECOLOGY: ANNUAL EXAM   Subjective:    PCP: [facility] KAILER MACLIN is a 52 y.o. female [phone] who presents for annual wellness visit.   Well Woman Visit:  GYN HISTORY:  No LMP recorded (lmp unknown). Patient is postmenopausal.     Menstrual History: OB History     Gravida  7   Para  3   Term  3   Preterm      AB  4   Living  3      SAB  3   IAB      Ectopic      Multiple      Live Births              Menarche age: *** No LMP recorded (lmp unknown). Patient is postmenopausal.     Periods are every *** days, and last *** days, flow is light / moderate / heavy.  Uses pads / tampons / menstrual cup and changes it every *** hours.  Cramping is mild / moderate / severe.  Cyclic symptoms include: {symptoms; gyn cyclic:13153}.  Intermenstrual bleeding, spotting, or discharge? *** Urinary incontinence? ***  Sexually active: *** Number of sexual partners: *** Gender of sexual Partners: *** Social History   Substance and Sexual Activity  Sexual Activity Not Currently   Contraceptive methods: {PLAN CONTRACEPTION:313102} Dyspareunia? *** STI history: *** STI/HIV testing or immunizations needed? {yes JY:782956}   Health Maintenance: -Last pap: approximate date 03/08/22 and was normal NILM & HPV NEGATIVE --> Any abnormals: *** -Last mammogram: 03/01/23 --> Any abnormals? Incomplete: Need additional imaging evaluation  -Last colon cancer screen: *** / Type: *** -Last DEXA scan: *** -[facility] of Breast / Colon / Cervical cancer: *** -Vaccines:  Immunization History  Administered Date(s) Administered   Moderna Sars-Covid-2 Vaccination 11/12/2019, 12/19/2019   Pfizer Covid-19 Vaccine Bivalent Booster 34yrs & up 08/05/2021   Last Tdap: *** / Flu: *** / COVID: Completed / Gardasil: aged out / Shingles (50+): *** / PCV20:  -Hep C screen: *** -Last lipid / glucose screening: 10/13/22  > Exercise: {misc; exercise types:16438},  {exercise level:31265} > Dietary Supplements: Folate: {yes/no:20286};  Calcium: {yes/no:20286}}; Vitamin D: {yes/no:20286} > There is no height or weight on file to calculate BMI.  > Recent dental visit {yes no:314532} > Seat Belt Use: {yes no:314532} > Texting and driving? {yes no:314532} > Guns in the house {yes no:314532} > Recreational or other drug use: {Drug Use:32241}   Social History   Tobacco Use   Smoking status: Never   Smokeless tobacco: Never  Substance Use Topics   Alcohol use: No   Occupation: ***    Lives with: ***    PHQ-2 Score: In last two weeks, how often have you felt: Little interest or pleasure in doing things: {PHQGADfrequency:29690::"Not at all (0)"} Feeling down, depressed or hopeless: {PHQGADfrequency:29690::"Not at all (0)"} Score:   GAD-2 Over the last 2 weeks, how often have you been bothered by the following problems? Feeling nervous, anxious or on edge: {PHQGADfrequency:29690::"Not at all (0)"} Not being able to stop or control worrying: {PHQGADfrequency:29690::"Not at all (0)"}} Score: _________________________________________________________  Current Outpatient Medications  Medication Sig Dispense Refill   amphetamine-dextroamphetamine (ADDERALL) 30 MG tablet Take 1 tablet by mouth 2 (two) times daily.     ARIPiprazole (ABILIFY) 20 MG tablet Take 20 mg by mouth at bedtime as needed.     clonazePAM (KLONOPIN) 0.5 MG tablet Take 0.5 mg by mouth 2 (two) times daily.  cyclobenzaprine (FLEXERIL) 5 MG tablet Take 1 tablet (5 mg total) by mouth 3 (three) times daily as needed for muscle spasms. (Patient not taking: Reported on 03/08/2022) 15 tablet 0   estradiol (ESTRACE) 0.5 MG tablet Take 1 tablet (0.5 mg total) by mouth daily. 30 tablet 0   fish oil-omega-3 fatty acids 1000 MG capsule Take 1 g by mouth daily. (Patient not taking: Reported on 03/08/2022)     Multiple Vitamin (MULITIVITAMIN WITH MINERALS) TABS Take 1 tablet by mouth daily.  (Patient not taking: Reported on 03/08/2022)     nabumetone (RELAFEN) 750 MG tablet Take 1 tablet (750 mg total) by mouth 2 (two) times daily. (Patient not taking: Reported on 03/08/2022) 30 tablet 0   naproxen sodium (ANAPROX) 220 MG tablet Take 440 mg by mouth daily as needed. For pain (Patient not taking: Reported on 03/08/2022)     traMADol (ULTRAM) 50 MG tablet Take 50 mg by mouth every 6 (six) hours as needed. For pain (Patient not taking: Reported on 03/08/2022)     traMADol (ULTRAM) 50 MG tablet Take 1 tablet (50 mg total) by mouth 3 (three) times daily as needed. (Patient not taking: Reported on 03/08/2022) 12 tablet 0   No current facility-administered medications for this visit.   Allergies  Allergen Reactions   Lidocaine     Other reaction(s): Vomiting   Mango Flavor Swelling    Other reaction(s): Other (See Comments)  CONSTRICTED AIRWAY   Duloxetine     Other reaction(s): Other (See Comments) Other Reaction: OTHER REACTION   Mango Flavor [Flavoring Agent] Swelling   Scopolamine     Other reaction(s): Other (See Comments) Skin irritation   Codeine Rash   Penicillins Rash    Past Medical History:  Diagnosis Date   Fibroids    Hypertension    Kidney stone    Past Surgical History:  Procedure Laterality Date   APPENDECTOMY     CESAREAN SECTION     FINGER SURGERY     KNEE SURGERY     LITHOTRIPSY     TUBAL LIGATION      Review Of Systems  Constitutional: Denied constitutional symptoms, night sweats, recent illness, fatigue, fever, insomnia and weight loss.  Eyes: Denied eye symptoms, eye pain, photophobia, vision change and visual disturbance.  Ears/Nose/Throat/Neck: Denied ear, nose, throat or neck symptoms, hearing loss, nasal discharge, sinus congestion and sore throat.  Cardiovascular: Denied cardiovascular symptoms, arrhythmia, chest pain/pressure, edema, exercise intolerance, orthopnea and palpitations.  Respiratory: Denied pulmonary symptoms, asthma, pleuritic  pain, productive sputum, cough, dyspnea and wheezing.  Gastrointestinal: Denied, gastro-esophageal reflux, melena, nausea and vomiting.  Genitourinary:*** Denied genitourinary symptoms including symptomatic vaginal discharge, pelvic relaxation issues, and urinary complaints.  Musculoskeletal: Denied musculoskeletal symptoms, stiffness, swelling, muscle weakness and myalgia.  Dermatologic: Denied dermatology symptoms, rash and scar.  Neurologic: Denied neurology symptoms, dizziness, headache, neck pain and syncope.  Psychiatric: Denied psychiatric symptoms, anxiety and depression.  Endocrine: Denied endocrine symptoms including hot flashes and night sweats.      Objective:    LMP  (LMP Unknown)   Constitutional: Well-developed, well-nourished female in no acute distress Neurological: Alert and oriented to person, place, and time Psychiatric: Mood and affect appropriate Skin: No rashes or lesions Neck: Supple without masses. Trachea is midline.Thyroid is normal size without masses Lymphatics: No cervical, axillary, supraclavicular, or inguinal adenopathy noted Respiratory: Clear to auscultation bilaterally. Good air movement with normal work of breathing. Cardiovascular: Regular rate and rhythm. Extremities grossly normal, nontender with no  edema; pulses regular Gastrointestinal: Soft, nontender, nondistended. No masses or hernias appreciated. No hepatosplenomegaly. No fluid wave. No rebound or guarding. Breast Exam: {Exam; breast:13139::"normal appearance, no masses or tenderness"} Genitourinary:         External Genitalia: Normal female genitalia    Vagina: Normal mucosa, no lesions.    Cervix: No lesions, normal size and consistency; no cervical motion tenderness; non-friable; Pap not***obtained.    Uterus: Normal size and contour; smooth, mobile, NT, {Desc; anteverted/retroverted/midposition:60613}. Adnexae: Non-palpable and non-tender Perineum/Anus: No lesions Rectal: deferred     Assessment/Plan:    ANJANAE HASSAN is a 52 y.o. female [phone] with normal well-woman gynecologic exam.  -Screenings:  Pap: done with cotesting today  *** w/rflx today Mammogram: ordered***due *** Colon: ordered colonoscopy***Cologuard -OR- due *** Labs: ***A1C, CMP, HepC, Lipid panel, Vit D, TSH GAD***PHQ-2 = ***, discussed coping techniques; follow up with PCP if worsens or develops concern -Contraception: *** -Vaccines: UTD, Tdap today; pt declines Influenza. Gardasil: series not started, declined today.  -Healthy lifestyle modifications discussed: multivitamin, diet, exercise, sunscreen, tobacco and alcohol use. Emphasized importance of regular physical activity.  -Folate***Calcium and Vit D recommendation reviewed.  -All questions answered to patient's satisfaction.  -RTC 1 yr for annual, sooner prn.   No follow-ups on file.    Julieanne Manson, DO [facility] at [facility]

## 2023-08-21 ENCOUNTER — Other Ambulatory Visit: Payer: Self-pay | Admitting: Obstetrics & Gynecology

## 2023-08-21 DIAGNOSIS — N951 Menopausal and female climacteric states: Secondary | ICD-10-CM

## 2024-02-06 ENCOUNTER — Encounter: Payer: Self-pay | Admitting: Obstetrics & Gynecology

## 2024-02-06 ENCOUNTER — Other Ambulatory Visit (HOSPITAL_COMMUNITY)
Admission: RE | Admit: 2024-02-06 | Discharge: 2024-02-06 | Disposition: A | Discharge status: Home / Self Care | Payer: Medicare (Managed Care) | Source: Ambulatory Visit | Attending: Obstetrics & Gynecology | Admitting: Obstetrics & Gynecology

## 2024-02-06 ENCOUNTER — Ambulatory Visit (INDEPENDENT_AMBULATORY_CARE_PROVIDER_SITE_OTHER): Payer: Medicare (Managed Care) | Admitting: Obstetrics & Gynecology

## 2024-02-06 ENCOUNTER — Telehealth: Payer: Self-pay

## 2024-02-06 VITALS — BP 141/99 | HR 76 | Ht 67.0 in | Wt 168.3 lb

## 2024-02-06 DIAGNOSIS — Z01419 Encounter for gynecological examination (general) (routine) without abnormal findings: Secondary | ICD-10-CM

## 2024-02-06 DIAGNOSIS — N950 Postmenopausal bleeding: Secondary | ICD-10-CM

## 2024-02-06 DIAGNOSIS — N23 Unspecified renal colic: Secondary | ICD-10-CM

## 2024-02-06 DIAGNOSIS — R109 Unspecified abdominal pain: Secondary | ICD-10-CM

## 2024-02-06 DIAGNOSIS — Z1231 Encounter for screening mammogram for malignant neoplasm of breast: Secondary | ICD-10-CM

## 2024-02-06 DIAGNOSIS — Z1211 Encounter for screening for malignant neoplasm of colon: Secondary | ICD-10-CM

## 2024-02-06 DIAGNOSIS — Z87442 Personal history of urinary calculi: Secondary | ICD-10-CM

## 2024-02-06 DIAGNOSIS — Z1151 Encounter for screening for human papillomavirus (HPV): Secondary | ICD-10-CM | POA: Diagnosis not present

## 2024-02-06 DIAGNOSIS — Z124 Encounter for screening for malignant neoplasm of cervix: Secondary | ICD-10-CM

## 2024-02-06 MED ORDER — ESTRADIOL-LEVONORGESTREL 0.045-0.015 MG/DAY TD PTWK: 1.0000 | MEDICATED_PATCH | TRANSDERMAL | 12 refills | Status: AC

## 2024-02-06 MED ORDER — ESTRADIOL-NORETHINDRONE ACET 0.05-0.14 MG/DAY TD PTTW: 1.0000 | MEDICATED_PATCH | TRANSDERMAL | 12 refills | Status: AC

## 2024-02-06 NOTE — Telephone Encounter (Signed)
 Nancy Roberson

## 2024-02-06 NOTE — Progress Notes (Addendum)
 ANNUAL PREVENTATIVE CARE GYNECOLOGY  ENCOUNTER NOTE  Subjective:       Nancy Roberson is a 53 y.o. engaged [phone]  (LC 2 - 9 and 58 yos) here for a routine annual gynecologic exam. The patient is sexually active. The patient is taking hormone replacement therapy. The patient wears seatbelts: yes. The patient participates in regular exercise: no. Has the patient ever been transfused or tattooed?: yes. The patient reports that there is not domestic violence in her life.  Current complaints: 1.  She would like a refill of ERT, has helped greatly. She ran out several months ago.  She has had some bleeding eery 2-3 months.   2. She also has a h/o kidney stones and feels the same right CVAT.    Gynecologic History No LMP recorded (lmp unknown). Patient is postmenopausal.  Last Pap: 2023. Results were: normal but long h/o abnormal pap smears Last mammogram: 2024. Results were: normal Last Colonoscopy: none    Obstetric History OB History  Gravida Para Term Preterm AB Living  7 3 3  4 3   SAB IAB Ectopic Multiple Live Births  3        # Outcome Date GA Lbr Len/2nd Weight Sex Type Anes PTL Lv  7 AB           6 SAB           5 SAB           4 SAB           3 Term           2 Term           1 Term             Past Medical History:  Diagnosis Date   Fibroids    Hypertension    Kidney stone     Family History  Problem Relation Age of Onset   Breast cancer Maternal Grandmother     Past Surgical History:  Procedure Laterality Date   APPENDECTOMY     CESAREAN SECTION     FINGER SURGERY     KNEE SURGERY     LITHOTRIPSY     TUBAL LIGATION      Social History   Socioeconomic History   Marital status: Single    Spouse name: Not on file   Number of children: Not on file   Years of education: Not on file   Highest education level: Not on file  Occupational History   Not on file  Tobacco Use   Smoking status: Never   Smokeless tobacco: Never  Vaping Use    Vaping status: Every Day  Substance and Sexual Activity   Alcohol use: No   Drug use: No   Sexual activity: Yes    Birth control/protection: Post-menopausal  Other Topics Concern   Not on file  Social History Narrative   Not on file   Social Drivers of Health   Financial Resource Strain: Medium Risk (08/06/2022)   Received from [facility], [facility]   Overall Financial Resource Strain (CARDIA)    Difficulty of Paying Living Expenses: Somewhat hard  Food Insecurity: Food Insecurity Present (08/06/2022)   Received from [facility], [facility]   Hunger Vital Sign    Worried About Running Out of Food in the Last Year: Sometimes true    Ran Out of Food in the Last Year: Patient declined  Transportation Needs: Unmet Transportation Needs (08/06/2022)   Received from [facility], [facility]   [facility] - Transportation    In the past 12 months, has lack of transportation kept you from medical appointments or from getting medications?: Yes    Lack of Transportation (Non-Medical): Yes  Physical Activity: Not on file  Stress: Not on file  Social Connections: Not on file  Intimate Partner Violence: Not on file    Current Outpatient Medications on File Prior to Visit  Medication Sig Dispense Refill   amphetamine-dextroamphetamine (ADDERALL) 30 MG tablet Take 1 tablet by mouth 2 (two) times daily.     ARIPiprazole (ABILIFY) 20 MG tablet Take 20 mg by mouth at bedtime as needed.     clonazePAM (KLONOPIN) 0.5 MG tablet Take 0.5 mg by mouth 2 (two) times daily.     estradiol  (ESTRACE ) 0.5 MG tablet Take 1 tablet (0.5 mg total) by mouth daily. 30 tablet 0   cyclobenzaprine  (FLEXERIL ) 5 MG tablet Take 1 tablet (5 mg total) by mouth 3 (three) times daily as needed for muscle spasms. (Patient not taking: Reported on 03/08/2022) 15 tablet 0   fish oil-omega-3 fatty acids 1000 MG capsule Take 1  g by mouth daily. (Patient not taking: Reported on 03/08/2022)     Multiple Vitamin (MULITIVITAMIN WITH MINERALS) TABS Take 1 tablet by mouth daily. (Patient not taking: Reported on 03/08/2022)     nabumetone  (RELAFEN ) 750 MG tablet Take 1 tablet (750 mg total) by mouth 2 (two) times daily. (Patient not taking: Reported on 03/08/2022) 30 tablet 0   naproxen sodium (ANAPROX) 220 MG tablet Take 440 mg by mouth daily as needed. For pain (Patient not taking: Reported on 03/08/2022)     traMADol  (ULTRAM ) 50 MG tablet Take 50 mg by mouth every 6 (six) hours as needed. For pain (Patient not taking: Reported on 03/08/2022)     traMADol  (ULTRAM ) 50 MG tablet Take 1 tablet (50 mg total) by mouth 3 (three) times daily as needed. (Patient not taking: Reported on 03/08/2022) 12 tablet 0   No current facility-administered medications on file prior to visit.    Allergies  Allergen Reactions   Lidocaine     Other reaction(s): Vomiting   Mango Flavoring Agent (Non-Screening) Swelling    Other reaction(s): Other (See Comments)  CONSTRICTED AIRWAY   Duloxetine     Other reaction(s): Other (See Comments) Other Reaction: OTHER REACTION   Mango Flavor [Flavoring Agent (Non-Screening)] Swelling   Scopolamine     Other reaction(s): Other (See Comments) Skin irritation   Codeine Rash   Penicillins Rash      Review of Systems ROS Review of Systems - General ROS: negative for - chills, fatigue, fever, hot flashes, night sweats, weight gain or weight loss Psychological ROS: negative for - anxiety, decreased libido, depression, mood swings, physical abuse or sexual abuse Ophthalmic ROS: negative for - blurry vision, eye pain or loss of vision ENT ROS: negative for - headaches, hearing change, visual changes or vocal changes Allergy and Immunology ROS: negative for - hives, itchy/watery eyes or seasonal allergies Hematological and Lymphatic ROS: negative for - bleeding problems, bruising, swollen lymph nodes or  weight loss Endocrine ROS: negative for - galactorrhea, hair pattern changes, hot flashes, malaise/lethargy, mood swings, palpitations, polydipsia/polyuria, skin changes, temperature intolerance or unexpected weight changes Breast ROS: negative for - new or changing breast lumps or nipple discharge Respiratory ROS: negative for - cough or shortness of breath Cardiovascular ROS:  negative for - chest pain, irregular heartbeat, palpitations or shortness of breath Gastrointestinal ROS: no abdominal pain, change in bowel habits, or black or bloody stools Genito-Urinary ROS: no dysuria, trouble voiding, or hematuria Musculoskeletal ROS: negative for - joint pain or joint stiffness Neurological ROS: negative for - bowel and bladder control changes Dermatological ROS: negative for rash and skin lesion changes   Objective:   BP (!) 141/99 (BP Location: Left Arm, Patient Position: Sitting, Cuff Size: Large)   Pulse 76   Ht 5\' 7"  (1.702 m)   Wt 168 lb 4.8 oz (76.3 kg)   LMP  (LMP Unknown)   BMI 26.36 kg/m  Well nourished, well hydrated White female, no apparent distress She is ambulating and conversing normally. General:  alert   Breasts:  inspection negative, no nipple discharge or bleeding, no masses or nodularity palpable  Lungs: clear to auscultation bilaterally  Heart:  regular rate and rhythm, S1, S2 normal, no murmur, click, rub or gallop  Abdomen: soft, non-tender; bowel sounds normal; no masses,  no organomegaly   Vulva:  normal  Vagina: normal  Cervix:  No lesions, normal discharge  Corpus: normal size and shape, anteverted uterus   Adnexa:  not enlarged or painful  Rectal Exam: Not performed.      Labs: Lab Results  Component Value Date   WBC 6.2 03/05/2018   HGB 13.9 03/05/2018   HCT 40.2 03/05/2018   MCV 85.4 03/05/2018   PLT 261 03/05/2018    Lab Results  Component Value Date   CREATININE 0.83 03/05/2018   BUN 9 03/05/2018   NA 145 03/05/2018   K 3.3 (L)  03/05/2018   CL 107 03/05/2018   CO2 27 03/05/2018    Lab Results  Component Value Date   ALT 15 06/17/2009   AST 15 06/17/2009   ALKPHOS 35 (L) 06/17/2009   BILITOT 0.5 06/17/2009    No results found for: "CHOL", "HDL", "LDLCALC", "LDLDIRECT", "TRIG", "CHOLHDL"  Lab Results  Component Value Date   TSH 0.667 02/03/2009    No results found for: "HGBA1C"   Assessment:   1. Well woman exam with routine gynecological exam   2. PMB 3. Flank pain and h/o kidney stones 4. HRT management   Plan:  Pap: pap with HR HPV ordered Mammogram: Ordered Colon Screening:  Cologard ordered  Routine preventative health maintenance measures emphasized: Exercise/Diet/Weight control  I prescribed both combipatch and climara  pro because I am not sure which one her insurance will cover. She will not take oral ERT again.  PMB- will need another gyn ultrasound.  Renal CT ordered and nephrology consult ordered  Jamieka Royle C Jabe Jeanbaptiste, MD [facility]

## 2024-02-08 LAB — CYTOLOGY - PAP
Comment: NEGATIVE
Diagnosis: NEGATIVE
Diagnosis: REACTIVE
High risk HPV: NEGATIVE

## 2024-02-08 LAB — URINE CULTURE

## 2024-02-13 NOTE — Telephone Encounter (Signed)
 We have been texting about her HRT. In my last text, I rec'd no more HRT until gyn ultrasound results are available in case she needs a EMBX first.

## 2024-02-14 ENCOUNTER — Other Ambulatory Visit: Payer: Medicare (Managed Care)

## 2024-02-17 ENCOUNTER — Ambulatory Visit: Payer: Medicare (Managed Care)

## 2024-02-21 ENCOUNTER — Ambulatory Visit (INDEPENDENT_AMBULATORY_CARE_PROVIDER_SITE_OTHER): Payer: Medicare (Managed Care)

## 2024-02-21 DIAGNOSIS — N950 Postmenopausal bleeding: Secondary | ICD-10-CM | POA: Diagnosis not present

## 2024-02-22 LAB — COLOGUARD: COLOGUARD: NEGATIVE

## 2024-02-23 NOTE — Telephone Encounter (Signed)
 Patient called about results from Ultrasound. The medication Dr. Starla prescribed is not covered by her insurance. Patient stated that Dr. Starla is waiting on results to decide what alternative medication she could take. Insurance only approves Prempro and Norethindronethinyle. Please advise.

## 2024-03-07 ENCOUNTER — Other Ambulatory Visit: Payer: Self-pay

## 2024-03-07 DIAGNOSIS — N951 Menopausal and female climacteric states: Secondary | ICD-10-CM

## 2024-03-07 MED ORDER — NORETHIN ACE-ETH ESTRAD-FE 1-20 MG-MCG(24) PO TABS: 1.0000 | ORAL_TABLET | Freq: Every day | ORAL | 5 refills | Status: AC

## 2024-03-08 ENCOUNTER — Other Ambulatory Visit: Payer: Self-pay

## 2024-03-08 DIAGNOSIS — N950 Postmenopausal bleeding: Secondary | ICD-10-CM

## 2024-03-08 DIAGNOSIS — N951 Menopausal and female climacteric states: Secondary | ICD-10-CM

## 2024-03-08 MED ORDER — MEDROXYPROGESTERONE ACETATE 2.5 MG PO TABS: 2.5000 mg | ORAL_TABLET | Freq: Every day | ORAL | 12 refills | Status: AC

## 2024-03-08 MED ORDER — ESTRADIOL 0.05 MG/24HR TD PTWK: 0.0500 mg | MEDICATED_PATCH | TRANSDERMAL | 12 refills | Status: AC
